# Patient Record
Sex: Female | Born: 1975
Health system: Southern US, Community
[De-identification: ages and names within clinical notes are randomized; demographics above are authoritative.]

## PROBLEM LIST (undated history)

## (undated) DIAGNOSIS — E119 Type 2 diabetes mellitus without complications: Secondary | ICD-10-CM

## (undated) DIAGNOSIS — F419 Anxiety disorder, unspecified: Secondary | ICD-10-CM

## (undated) DIAGNOSIS — D689 Coagulation defect, unspecified: Secondary | ICD-10-CM

## (undated) DIAGNOSIS — R7303 Prediabetes: Secondary | ICD-10-CM

## (undated) DIAGNOSIS — G43909 Migraine, unspecified, not intractable, without status migrainosus: Secondary | ICD-10-CM

## (undated) DIAGNOSIS — D649 Anemia, unspecified: Secondary | ICD-10-CM

## (undated) DIAGNOSIS — Z5189 Encounter for other specified aftercare: Secondary | ICD-10-CM

## (undated) DIAGNOSIS — R011 Cardiac murmur, unspecified: Secondary | ICD-10-CM

## (undated) DIAGNOSIS — I219 Acute myocardial infarction, unspecified: Secondary | ICD-10-CM

## (undated) HISTORY — DX: Coagulation defect, unspecified: D68.9

## (undated) HISTORY — DX: Migraine, unspecified, not intractable, without status migrainosus: G43.909

## (undated) HISTORY — PX: ABDOMINAL HYSTERECTOMY: SHX81

## (undated) HISTORY — DX: Encounter for other specified aftercare: Z51.89

## (undated) HISTORY — DX: Anxiety disorder, unspecified: F41.9

## (undated) HISTORY — PX: FOOT SURGERY: SHX648

## (undated) HISTORY — DX: Prediabetes: R73.03

## (undated) HISTORY — DX: Cardiac murmur, unspecified: R01.1

## (undated) HISTORY — PX: BREAST REDUCTION SURGERY: SHX8

## (undated) HISTORY — DX: Acute myocardial infarction, unspecified: I21.9

## (undated) HISTORY — PX: BREAST SURGERY: SHX581

## (undated) HISTORY — DX: Anemia, unspecified: D64.9

---

## 2008-11-07 DIAGNOSIS — N643 Galactorrhea not associated with childbirth: Secondary | ICD-10-CM | POA: Insufficient documentation

## 2008-11-07 DIAGNOSIS — K029 Dental caries, unspecified: Secondary | ICD-10-CM | POA: Insufficient documentation

## 2008-11-07 DIAGNOSIS — D649 Anemia, unspecified: Secondary | ICD-10-CM | POA: Insufficient documentation

## 2009-12-02 DIAGNOSIS — I2699 Other pulmonary embolism without acute cor pulmonale: Secondary | ICD-10-CM | POA: Insufficient documentation

## 2009-12-10 DIAGNOSIS — G43009 Migraine without aura, not intractable, without status migrainosus: Secondary | ICD-10-CM | POA: Insufficient documentation

## 2010-02-13 DIAGNOSIS — Z7901 Long term (current) use of anticoagulants: Secondary | ICD-10-CM | POA: Insufficient documentation

## 2010-05-20 DIAGNOSIS — F419 Anxiety disorder, unspecified: Secondary | ICD-10-CM | POA: Insufficient documentation

## 2010-05-20 DIAGNOSIS — L659 Nonscarring hair loss, unspecified: Secondary | ICD-10-CM | POA: Insufficient documentation

## 2010-05-20 DIAGNOSIS — N62 Hypertrophy of breast: Secondary | ICD-10-CM | POA: Insufficient documentation

## 2010-12-31 DIAGNOSIS — N62 Hypertrophy of breast: Secondary | ICD-10-CM | POA: Insufficient documentation

## 2011-04-08 DIAGNOSIS — R269 Unspecified abnormalities of gait and mobility: Secondary | ICD-10-CM | POA: Insufficient documentation

## 2011-10-23 DIAGNOSIS — M216X9 Other acquired deformities of unspecified foot: Secondary | ICD-10-CM | POA: Insufficient documentation

## 2011-10-23 DIAGNOSIS — M205X9 Other deformities of toe(s) (acquired), unspecified foot: Secondary | ICD-10-CM | POA: Insufficient documentation

## 2013-07-27 DIAGNOSIS — Z9889 Other specified postprocedural states: Secondary | ICD-10-CM | POA: Insufficient documentation

## 2014-01-28 DIAGNOSIS — N920 Excessive and frequent menstruation with regular cycle: Secondary | ICD-10-CM | POA: Insufficient documentation

## 2015-08-28 DIAGNOSIS — Z9889 Other specified postprocedural states: Secondary | ICD-10-CM | POA: Insufficient documentation

## 2017-12-28 DIAGNOSIS — R0602 Shortness of breath: Secondary | ICD-10-CM | POA: Insufficient documentation

## 2019-09-21 DIAGNOSIS — D500 Iron deficiency anemia secondary to blood loss (chronic): Secondary | ICD-10-CM | POA: Insufficient documentation

## 2019-10-17 DIAGNOSIS — D219 Benign neoplasm of connective and other soft tissue, unspecified: Secondary | ICD-10-CM | POA: Insufficient documentation

## 2020-02-22 ENCOUNTER — Other Ambulatory Visit (HOSPITAL_COMMUNITY): Payer: Self-pay

## 2020-02-22 MED FILL — ALBUTEROL SULFATE HFA 108 (: 108 (90 BAS | 17 days supply | Qty: 18 | Fill #0

## 2020-02-22 MED FILL — XARELTO 20 MG TABLET: 20 | 30 days supply | Qty: 30 | Fill #0

## 2020-04-25 ENCOUNTER — Other Ambulatory Visit (HOSPITAL_COMMUNITY): Payer: Self-pay

## 2020-04-25 MED FILL — OZEMPIC (1 MG/DOSE) 4 MG/3M: 4 | 84 days supply | Qty: 9 | Fill #0

## 2020-04-25 MED FILL — METFORMIN HCL ER 500 MG TB2: 500 | 90 days supply | Qty: 360 | Fill #0

## 2020-07-29 ENCOUNTER — Other Ambulatory Visit (HOSPITAL_COMMUNITY): Payer: Self-pay

## 2020-07-30 MED FILL — OZEMPIC (1 MG/DOSE) 4 MG/3M: 4 | 84 days supply | Qty: 9 | Fill #0

## 2020-09-11 ENCOUNTER — Encounter: Payer: Self-pay | Admitting: Nurse Practitioner

## 2020-09-11 ENCOUNTER — Ambulatory Visit (INDEPENDENT_AMBULATORY_CARE_PROVIDER_SITE_OTHER): Payer: No Typology Code available for payment source | Admitting: Nurse Practitioner

## 2020-09-11 ENCOUNTER — Other Ambulatory Visit: Payer: Self-pay

## 2020-09-11 ENCOUNTER — Other Ambulatory Visit (HOSPITAL_COMMUNITY): Payer: Self-pay

## 2020-09-11 VITALS — BP 122/84 | HR 86 | Temp 98.1°F | Ht 65.4 in | Wt 220.6 lb

## 2020-09-11 DIAGNOSIS — M546 Pain in thoracic spine: Secondary | ICD-10-CM | POA: Diagnosis not present

## 2020-09-11 DIAGNOSIS — E559 Vitamin D deficiency, unspecified: Secondary | ICD-10-CM

## 2020-09-11 DIAGNOSIS — Z1159 Encounter for screening for other viral diseases: Secondary | ICD-10-CM

## 2020-09-11 DIAGNOSIS — Z7689 Persons encountering health services in other specified circumstances: Secondary | ICD-10-CM

## 2020-09-11 DIAGNOSIS — Z114 Encounter for screening for human immunodeficiency virus [HIV]: Secondary | ICD-10-CM

## 2020-09-11 DIAGNOSIS — Z86718 Personal history of other venous thrombosis and embolism: Secondary | ICD-10-CM

## 2020-09-11 DIAGNOSIS — R7303 Prediabetes: Secondary | ICD-10-CM | POA: Diagnosis not present

## 2020-09-11 DIAGNOSIS — G8929 Other chronic pain: Secondary | ICD-10-CM

## 2020-09-11 DIAGNOSIS — Z6836 Body mass index (BMI) 36.0-36.9, adult: Secondary | ICD-10-CM

## 2020-09-11 DIAGNOSIS — Z86711 Personal history of pulmonary embolism: Secondary | ICD-10-CM

## 2020-09-11 MED ORDER — PREDNISONE 5 MG PO TABS
ORAL_TABLET | ORAL | 0 refills | Status: AC
  Filled 2020-09-11: qty 21, 6d supply, fill #0

## 2020-09-11 NOTE — Progress Notes (Signed)
I,Yamilka Roman [employer] as a Education administrator for [employer], FNP.,have documented all relevant documentation on the behalf of [name], FNP,as directed by  [name], FNP while in the presence of [name], [place]. This visit occurred during the SARS-CoV-2 public health emergency.  Safety protocols were in place, including screening questions prior to the visit, additional usage of staff PPE, and extensive cleaning of exam room while observing appropriate contact time as indicated for disinfecting solutions.  Subjective:     Patient ID: Kristi Frank , female    DOB: 1975/06/11 , 45 y.o.   MRN: [id number]   Chief Complaint  Patient presents with  . Establish Care  . Back Pain    Patient stated she has been having back pain since last year the pain comes and goes. She did have a scan sometime ago back in [place] and was told it may be her sciatic nerve.      HPI  Patient presents today to establish primary care. She was referred by her friend Sharman Crate who may come to the practice. She recently moved here in October from [place].  She works at [employer] in [employer].  Single. 3 children - 2 daughters live locally and son lives in [place].   She would like to be seen for back pain. Patient stated she has been having back pain since last year the pain comes and goes. She did have a scan sometime ago back in [place] and was told it may be her sciatic nerve.    PMH - Pulmonary Embolism and several other blood clots (lower left back, she had 2 in 2011 possible left side). She takes Xarelto (will sometimes take and sometimes does not). She has been seen by a Hematologist, she did take lovenox for 1-2 years. She was able to get her weight down and that is when she began taking the Xarelto. She has not taken in several months. Anxiety - used to take a medication after having her PE in 2011. In the last few months has not been doing as well with her anxiety. Migraine -   Prediabetes - has been on Ozempic consistently since last January and metformin. Her highest weight was 315 lb in 2020.   She is approximately 100 lbs lighter now.   Wt Readings from Last 3 Encounters: 09/11/20 : 220 lb 9.6 oz (100.1 kg)  She had a medication for what she thought was for nerve pain to her back, had relief.  Pain will improve up to 60%, denies numbness or tingling. Denies having shingles. She does report sometimes she has irritation to her back where she has pain.   Back Pain This is a chronic problem. The current episode started more than 1 month ago (August 2021). The problem has been gradually worsening since onset. Pertinent negatives include no abdominal pain, chest pain, headaches, numbness or tingling. (She will also feel pain down her left hip)     Past Medical History:  Diagnosis Date  . Anxiety   . Blood clotting disorder ([place])   . Migraine   . Prediabetes      Family History  Problem Relation Age of Onset  . Epilepsy Mother   . Hypertension Mother   . Healthy Father   . Diabetes Maternal Aunt   . Cancer Paternal Uncle   . Healthy Maternal Grandmother   . Dementia Paternal Grandmother   . Heart disease Paternal Grandfather   . Clotting disorder Other  Current Outpatient Medications:  .  albuterol (VENTOLIN HFA) 108 (90 Base) MCG/ACT inhaler, INHALE 2 PUFFS EVERY 4 HOURS AS NEEDED FOR SHORTNESS OF BREATH., Disp: 18 g, Rfl: 0 .  metFORMIN (GLUCOPHAGE-XR) 500 MG 24 hr tablet, TAKE 2 TABLETS BY MOUTH 2 TIMES A DAY WITH MEALS, Disp: 360 tablet, Rfl: 0 .  predniSONE (DELTASONE) 5 MG tablet, Take 6 tablets by mouth on day 1,  5 tablets on day 2,  4 tablets on day 3, 3 tablets on day 4, 2 tablets on day 5, and 1 tablet on day 6 and stop, Disp: 21 tablet, Rfl: 0 .  rivaroxaban (XARELTO) 20 MG TABS tablet, TAKE 1 TABLET BY MOUTH DAILY WITH DINNER., Disp: 90 tablet, Rfl: 0 .  Semaglutide, 1 MG/DOSE, 4 MG/3ML SOPN, INJECT 1 MG UNDER THE SKIN ONCE A WEEK.,  Disp: 9 mL, Rfl: 0   No Known Allergies   Review of Systems  Constitutional: Negative.  Negative for fatigue.  Respiratory: Negative.  Negative for cough and shortness of breath.   Cardiovascular: Negative.  Negative for chest pain.  Gastrointestinal: Negative.  Negative for abdominal pain.  Endocrine: Negative.  Negative for polydipsia, polyphagia and polyuria.  Musculoskeletal: Positive for back pain.  Skin: Negative.  Negative for rash and wound.       She will have discomfort or irritation to the left upper back Sharp burning pain  Neurological: Negative.  Negative for dizziness, tingling, numbness and headaches.  Psychiatric/Behavioral: Negative for confusion. The patient is not nervous/anxious.      Today's Vitals   09/11/20 1557  BP: 122/84  Pulse: 86  Temp: 98.1 F (36.7 C)  TempSrc: Oral  SpO2: 99%  Weight: 220 lb 9.6 oz (100.1 kg)  Height: 5' 5.4" (1.661 m)  PainSc: 2   PainLoc: Back   Body mass index is 36.26 kg/m.   Objective:  Physical Exam Vitals reviewed.  Cardiovascular:     Rate and Rhythm: Normal rate and regular rhythm.     Pulses: Normal pulses.     Heart sounds: Normal heart sounds. No murmur heard.   Pulmonary:     Effort: Pulmonary effort is normal. No respiratory distress.     Breath sounds: Normal breath sounds. No wheezing.  Skin:    General: Skin is warm and dry.     Capillary Refill: Capillary refill takes less than 2 seconds.     Coloration: Skin is not jaundiced.     Findings: No bruising.  Neurological:     General: No focal deficit present.     Mental Status: She is oriented to person, place, and time.     Cranial Nerves: No cranial nerve deficit.  Psychiatric:        Mood and Affect: Mood normal.        Behavior: Behavior normal.        Thought Content: Thought content normal.        Judgment: Judgment normal.         Assessment And Plan:     1. Chronic left-sided thoracic back pain  Sciatic back pain vs muscle  strain  Will treat with steroid dose pack  Encouraged to stretch regularly - predniSONE (DELTASONE) 5 MG tablet; Take 6 tablets by mouth on day 1,  5 tablets on day 2,  4 tablets on day 3, 3 tablets on day 4, 2 tablets on day 5, and 1 tablet on day 6 and stop  Dispense: 21 tablet; Refill: 0  2.  Vitamin D deficiency  Will check vitamin D level and supplement as needed.     Also encouraged to spend 15 minutes in the sun daily.  - VITAMIN D 25 Hydroxy (Vit-D Deficiency, Fractures)  3. Prediabetes  Chronic, controlled  Continue with current medications  Encouraged to limit intake of sugary foods and drinks  Encouraged to increase physical activity to 150 minutes per week  4. BMI 36.0-36.9,adult  Chronic  Discussed healthy diet and regular exercise options   Encouraged to exercise at least 150 minutes per week with 2 days of strength training  5. History of pulmonary embolism Taking Xarelto  6. History of blood clots  7. Encounter for hepatitis C screening test for low risk patient  Will check Hepatitis C screening due to recent recommendations to screen all adults 18 years and older - Hepatitis C antibody  8. Encounter for HIV (human immunodeficiency virus) test - HIV Antibody (routine testing w rflx)  9. Establishing care with new doctor, encounter for     Patient was given opportunity to ask questions. Patient verbalized understanding of the plan and was able to repeat key elements of the plan. All questions were answered to their satisfaction.  [name], FNP   I, [name], FNP, have reviewed all documentation for this visit. The documentation on 09/11/20 for the exam, diagnosis, procedures, and orders are all accurate and complete.   IF YOU HAVE BEEN REFERRED TO A SPECIALIST, IT MAY TAKE 1-2 WEEKS TO SCHEDULE/PROCESS THE REFERRAL. IF YOU HAVE NOT HEARD FROM US/SPECIALIST IN TWO WEEKS, PLEASE GIVE [place] A CALL AT [phone] X [phone].   THE PATIENT IS  ENCOURAGED TO PRACTICE SOCIAL DISTANCING DUE TO THE COVID-19 PANDEMIC.

## 2020-09-11 NOTE — Patient Instructions (Signed)
Sciatica Rehab Ask your health care provider which exercises are safe for you. Do exercises exactly as told by your health care provider and adjust them as directed. It is normal to feel mild stretching, pulling, tightness, or discomfort as you do these exercises. Stop right away if you feel sudden pain or your pain gets worse. Do not begin these exercises until told by your health care provider. Stretching and range-of-motion exercises These exercises warm up your muscles and joints and improve the movement and flexibility of your hips and back. These exercises also help to relieve pain, numbness, and tingling. Sciatic nerve glide 1. Sit in a chair with your head facing down toward your chest. Place your hands behind your back. Let your shoulders slump forward. 2. Slowly straighten one of your legs while you tilt your head back as if you are looking toward the ceiling. Only straighten your leg as far as you can without making your symptoms worse. 3. Hold this position for __________ seconds. 4. Slowly return to the starting position. 5. Repeat with your other leg. Repeat __________ times. Complete this exercise __________ times a day. Knee to chest with hip adduction and internal rotation 1. Lie on your back on a firm surface with both legs straight. 2. Bend one of your knees and move it up toward your chest until you feel a gentle stretch in your lower back and buttock. Then, move your knee toward the shoulder that is on the opposite side from your leg. This is hip adduction and internal rotation. ? Hold your leg in this position by holding on to the front of your knee. 3. Hold this position for __________ seconds. 4. Slowly return to the starting position. 5. Repeat with your other leg. Repeat __________ times. Complete this exercise __________ times a day.   Prone extension on elbows 1. Lie on your abdomen on a firm surface. A bed may be too soft for this exercise. 2. Prop yourself up on  your elbows. 3. Use your arms to help lift your chest up until you feel a gentle stretch in your abdomen and your lower back. ? This will place some of your body weight on your elbows. If this is uncomfortable, try stacking pillows under your chest. ? Your hips should stay down, against the surface that you are lying on. Keep your hip and back muscles relaxed. 4. Hold this position for __________ seconds. 5. Slowly relax your upper body and return to the starting position. Repeat __________ times. Complete this exercise __________ times a day.   Strengthening exercises These exercises build strength and endurance in your back. Endurance is the ability to use your muscles for a long time, even after they get tired. Pelvic tilt This exercise strengthens the muscles that lie deep in the abdomen. 1. Lie on your back on a firm surface. Bend your knees and keep your feet flat on the floor. 2. Tense your abdominal muscles. Tip your pelvis up toward the ceiling and flatten your lower back into the floor. ? To help with this exercise, you may place a small towel under your lower back and try to push your back into the towel. 3. Hold this position for __________ seconds. 4. Let your muscles relax completely before you repeat this exercise. Repeat __________ times. Complete this exercise __________ times a day. Alternating arm and leg raises 1. Get on your hands and knees on a firm surface. If you are on a hard floor, you may want to  use padding, such as an exercise mat, to cushion your knees. 2. Line up your arms and legs. Your hands should be directly below your shoulders, and your knees should be directly below your hips. 3. Lift your left leg behind you. At the same time, raise your right arm and straighten it in front of you. ? Do not lift your leg higher than your hip. ? Do not lift your arm higher than your shoulder. ? Keep your abdominal and back muscles tight. ? Keep your hips facing the  ground. ? Do not arch your back. ? Keep your balance carefully, and do not hold your breath. 4. Hold this position for __________ seconds. 5. Slowly return to the starting position. 6. Repeat with your right leg and your left arm. Repeat __________ times. Complete this exercise __________ times a day.   Posture and body mechanics Good posture and healthy body mechanics can help to relieve stress in your body's tissues and joints. Body mechanics refers to the movements and positions of your body while you do your daily activities. Posture is part of body mechanics. Good posture means:  Your spine is in its natural S-curve position (neutral).  Your shoulders are pulled back slightly.  Your head is not tipped forward. Follow these guidelines to improve your posture and body mechanics in your everyday activities. Standing  When standing, keep your spine neutral and your feet about hip width apart. Keep a slight bend in your knees. Your ears, shoulders, and hips should line up.  When you do a task in which you stand in one place for a long time, place one foot up on a stable object that is 2-4 inches (5-10 cm) high, such as a footstool. This helps keep your spine neutral.   Sitting  When sitting, keep your spine neutral and keep your feet flat on the floor. Use a footrest, if necessary, and keep your thighs parallel to the floor. Avoid rounding your shoulders, and avoid tilting your head forward.  When working at a desk or a computer, keep your desk at a height where your hands are slightly lower than your elbows. Slide your chair under your desk so you are close enough to maintain good posture.  When working at a computer, place your monitor at a height where you are looking straight ahead and you do not have to tilt your head forward or downward to look at the screen.   Resting  When lying down and resting, avoid positions that are most painful for you.  If you have pain with activities  such as sitting, bending, stooping, or squatting, lie in a position in which your body does not bend very much. For example, avoid curling up on your side with your arms and knees near your chest (fetal position).  If you have pain with activities such as standing for a long time or reaching with your arms, lie with your spine in a neutral position and bend your knees slightly. Try the following positions: ? Lying on your side with a pillow between your knees. ? Lying on your back with a pillow under your knees. Lifting  When lifting objects, keep your feet at least shoulder width apart and tighten your abdominal muscles.  Bend your knees and hips and keep your spine neutral. It is important to lift using the strength of your legs, not your back. Do not lock your knees straight out.  Always ask for help to lift heavy or awkward objects.  This information is not intended to replace advice given to you by your health care provider. Make sure you discuss any questions you have with your health care provider. Document Revised: 08/12/2018 Document Reviewed: 05/12/2018 Elsevier Patient Education  2021 [employer] Inc.  

## 2020-09-12 ENCOUNTER — Other Ambulatory Visit (HOSPITAL_COMMUNITY): Payer: Self-pay

## 2020-09-12 ENCOUNTER — Other Ambulatory Visit: Payer: Self-pay | Admitting: Nurse Practitioner

## 2020-09-12 DIAGNOSIS — E559 Vitamin D deficiency, unspecified: Secondary | ICD-10-CM

## 2020-09-12 LAB — HIV ANTIBODY (ROUTINE TESTING W REFLEX): HIV Screen 4th Generation wRfx: NONREACTIVE

## 2020-09-12 LAB — HEPATITIS C ANTIBODY: Hep C Virus Ab: <0.1 s/co ratio (ref 0.0–0.9)

## 2020-09-12 LAB — VITAMIN D 25 HYDROXY (VIT D DEFICIENCY, FRACTURES): Vit D, 25-Hydroxy: 14.3 ng/mL — ABNORMAL LOW (ref 30.0–100.0)

## 2020-09-12 MED ORDER — VITAMIN D (ERGOCALCIFEROL) 1.25 MG (50000 UNIT) PO CAPS
50000.0000 [IU] | ORAL_CAPSULE | ORAL | 1 refills | Status: DC
  Filled 2020-09-12 – 2020-12-05 (×2): qty 24, 84d supply, fill #0

## 2020-09-20 ENCOUNTER — Other Ambulatory Visit (HOSPITAL_COMMUNITY): Payer: Self-pay

## 2020-10-24 ENCOUNTER — Ambulatory Visit: Payer: No Typology Code available for payment source | Admitting: Nurse Practitioner

## 2020-11-11 ENCOUNTER — Other Ambulatory Visit (HOSPITAL_COMMUNITY): Payer: Self-pay

## 2020-11-11 ENCOUNTER — Ambulatory Visit (INDEPENDENT_AMBULATORY_CARE_PROVIDER_SITE_OTHER): Payer: No Typology Code available for payment source | Admitting: Nurse Practitioner

## 2020-11-11 ENCOUNTER — Other Ambulatory Visit: Payer: Self-pay

## 2020-11-11 ENCOUNTER — Encounter: Payer: Self-pay | Admitting: Nurse Practitioner

## 2020-11-11 VITALS — BP 126/80 | HR 85 | Temp 97.9°F | Ht 65.4 in | Wt 223.4 lb

## 2020-11-11 DIAGNOSIS — Z9109 Other allergy status, other than to drugs and biological substances: Secondary | ICD-10-CM | POA: Diagnosis not present

## 2020-11-11 DIAGNOSIS — R21 Rash and other nonspecific skin eruption: Secondary | ICD-10-CM

## 2020-11-11 MED ORDER — CLOBETASOL PROPIONATE 0.05 % EX OINT
1.0000 "application " | TOPICAL_OINTMENT | Freq: Two times a day (BID) | CUTANEOUS | 2 refills | Status: DC
  Filled 2020-11-11 – 2020-12-03 (×2): qty 30, 15d supply, fill #0

## 2020-11-11 NOTE — Progress Notes (Signed)
I,Yamilka [employer] as a Education administrator for [employer], FNP.,have documented all relevant documentation on the behalf of [name], FNP,as directed by  [name], FNP while in the presence of [name], [place].  This visit occurred during the SARS-CoV-2 public health emergency.  Safety protocols were in place, including screening questions prior to the visit, additional usage of staff PPE, and extensive cleaning of exam room while observing appropriate contact time as indicated for disinfecting solutions.  Subjective:     Patient ID: Kristi Frank , female    DOB: May 17, 1975 , 45 y.o.   MRN: [id number]   Chief Complaint  Patient presents with   discoloration of hands and arms    HPI  Patient presents today for some discoloration on her hands and arms. Reports in 2009 sh ehad a rash to her chest area. She began getting spots on her hands. Worse from April to September. She has sleeves for when she is driving. On a bad day she red, raised area to chest and hands. She will also have itching. She is not taking any antihistamines.      Past Medical History:  Diagnosis Date   Anxiety    Blood clotting disorder ([place])    Migraine    Prediabetes      Family History  Problem Relation Age of Onset   Epilepsy Mother    Hypertension Mother    Healthy Father    Diabetes Maternal Aunt    Cancer Paternal Uncle    Healthy Maternal Grandmother    Dementia Paternal Grandmother    Heart disease Paternal Grandfather    Clotting disorder Other      Current Outpatient Medications:    albuterol (VENTOLIN HFA) 108 (90 Base) MCG/ACT inhaler, INHALE 2 PUFFS EVERY 4 HOURS AS NEEDED FOR SHORTNESS OF BREATH., Disp: 18 g, Rfl: 0   clobetasol ointment (TEMOVATE) 2.95 %, Apply 1 application topically 2 (two) times daily., Disp: 30 g, Rfl: 2   metFORMIN (GLUCOPHAGE-XR) 500 MG 24 hr tablet, TAKE 2 TABLETS BY MOUTH 2 TIMES A DAY WITH MEALS, Disp: 360 tablet, Rfl: 0   rivaroxaban (XARELTO) 20 MG  TABS tablet, TAKE 1 TABLET BY MOUTH DAILY WITH DINNER., Disp: 90 tablet, Rfl: 0   Semaglutide, 1 MG/DOSE, 4 MG/3ML SOPN, INJECT 1 MG UNDER THE SKIN ONCE A WEEK., Disp: 9 mL, Rfl: 0   Vitamin D, Ergocalciferol, (DRISDOL) 1.25 MG (50000 UNIT) CAPS capsule, Take 1 capsule (50,000 Units total) by mouth 2 (two) times a week., Disp: 24 capsule, Rfl: 1   No Known Allergies   Review of Systems  Constitutional: Negative.   Respiratory: Negative.    Cardiovascular: Negative.  Negative for chest pain, palpitations and leg swelling.  Neurological:  Negative for dizziness and facial asymmetry.  Psychiatric/Behavioral: Negative.      Today's Vitals   11/11/20 1654  BP: 126/80  Pulse: 85  Temp: 97.9 F (36.6 C)  Weight: 223 lb 6.4 oz (101.3 kg)  Height: 5' 5.4" (1.661 m)  PainSc: 0-No pain   Body mass index is 36.72 kg/m.   Objective:  Physical Exam Vitals reviewed.  Constitutional:      General: She is not in acute distress.    Appearance: Normal appearance.  Cardiovascular:     Pulses: Normal pulses.     Heart sounds: Normal heart sounds. No murmur heard. Pulmonary:     Effort: Pulmonary effort is normal. No respiratory distress.     Breath sounds: Normal breath sounds.  No wheezing.  Skin:    Findings: Rash (hypopigmented areas to her hands and raised vesicles) present.  Neurological:     General: No focal deficit present.     Mental Status: She is alert and oriented to person, place, and time.  Psychiatric:        Mood and Affect: Mood normal.        Behavior: Behavior normal.        Thought Content: Thought content normal.        Judgment: Judgment normal.        Assessment And Plan:     1. Sun allergy Has hypopigmented skin with raised vesicles, will treat with clobetasol, if not effective will refer to Dermatology - clobetasol ointment (TEMOVATE) 0.05 %; Apply 1 application topically 2 (two) times daily.  Dispense: 30 g; Refill: 2    Patient was given opportunity to  ask questions. Patient verbalized understanding of the plan and was able to repeat key elements of the plan. All questions were answered to their satisfaction.  [name], FNP   I, [name], FNP, have reviewed all documentation for this visit. The documentation on 11/11/20 for the exam, diagnosis, procedures, and orders are all accurate and complete.   IF YOU HAVE BEEN REFERRED TO A SPECIALIST, IT MAY TAKE 1-2 WEEKS TO SCHEDULE/PROCESS THE REFERRAL. IF YOU HAVE NOT HEARD FROM US/SPECIALIST IN TWO WEEKS, PLEASE GIVE [place] A CALL AT [phone].   THE PATIENT IS ENCOURAGED TO PRACTICE SOCIAL DISTANCING DUE TO THE COVID-19 PANDEMIC.

## 2020-11-17 ENCOUNTER — Encounter: Payer: Self-pay | Admitting: Nurse Practitioner

## 2020-11-19 ENCOUNTER — Other Ambulatory Visit (HOSPITAL_COMMUNITY): Payer: Self-pay

## 2020-11-21 ENCOUNTER — Other Ambulatory Visit (HOSPITAL_COMMUNITY): Payer: Self-pay

## 2020-11-21 MED ORDER — METFORMIN HCL ER 500 MG PO TB24
1000.0000 mg | ORAL_TABLET | Freq: Two times a day (BID) | ORAL | 1 refills | Status: DC
  Filled 2020-11-21 – 2020-12-05 (×2): qty 120, 30d supply, fill #0
  Filled 2021-01-13: qty 120, 30d supply, fill #1

## 2020-11-21 MED ORDER — OZEMPIC (1 MG/DOSE) 4 MG/3ML ~~LOC~~ SOPN
1.0000 mg | PEN_INJECTOR | SUBCUTANEOUS | 1 refills | Status: DC
  Filled 2020-11-21 – 2020-12-03 (×2): qty 3, 28d supply, fill #0
  Filled 2021-01-13: qty 3, 28d supply, fill #1

## 2020-11-29 ENCOUNTER — Other Ambulatory Visit (HOSPITAL_COMMUNITY): Payer: Self-pay

## 2020-12-03 ENCOUNTER — Other Ambulatory Visit (HOSPITAL_COMMUNITY): Payer: Self-pay

## 2020-12-05 ENCOUNTER — Other Ambulatory Visit (HOSPITAL_COMMUNITY): Payer: Self-pay

## 2021-01-13 ENCOUNTER — Other Ambulatory Visit (HOSPITAL_COMMUNITY): Payer: Self-pay

## 2021-02-06 ENCOUNTER — Other Ambulatory Visit (HOSPITAL_COMMUNITY): Payer: Self-pay

## 2021-02-06 ENCOUNTER — Encounter: Payer: Self-pay | Admitting: Nurse Practitioner

## 2021-02-06 MED FILL — Rivaroxaban Tab 20 MG: ORAL | 30 days supply | Qty: 30 | Fill #0 | Status: AC

## 2021-02-07 ENCOUNTER — Telehealth: Payer: Self-pay

## 2021-02-07 NOTE — Telephone Encounter (Signed)
The pt was contacted for an appointment, appt scheduled.

## 2021-02-10 ENCOUNTER — Other Ambulatory Visit (HOSPITAL_COMMUNITY): Payer: Self-pay

## 2021-02-10 ENCOUNTER — Encounter: Payer: Self-pay | Admitting: Nurse Practitioner

## 2021-02-10 ENCOUNTER — Other Ambulatory Visit: Payer: Self-pay

## 2021-02-10 ENCOUNTER — Ambulatory Visit (INDEPENDENT_AMBULATORY_CARE_PROVIDER_SITE_OTHER): Payer: No Typology Code available for payment source | Admitting: Nurse Practitioner

## 2021-02-10 VITALS — BP 134/80 | HR 84 | Temp 97.9°F | Ht 65.8 in | Wt 231.6 lb

## 2021-02-10 DIAGNOSIS — R29898 Other symptoms and signs involving the musculoskeletal system: Secondary | ICD-10-CM | POA: Diagnosis not present

## 2021-02-10 DIAGNOSIS — Z23 Encounter for immunization: Secondary | ICD-10-CM | POA: Diagnosis not present

## 2021-02-10 DIAGNOSIS — R7303 Prediabetes: Secondary | ICD-10-CM

## 2021-02-10 DIAGNOSIS — Z6837 Body mass index (BMI) 37.0-37.9, adult: Secondary | ICD-10-CM

## 2021-02-10 DIAGNOSIS — Z1231 Encounter for screening mammogram for malignant neoplasm of breast: Secondary | ICD-10-CM

## 2021-02-10 DIAGNOSIS — Z86711 Personal history of pulmonary embolism: Secondary | ICD-10-CM

## 2021-02-10 DIAGNOSIS — E6609 Other obesity due to excess calories: Secondary | ICD-10-CM | POA: Diagnosis not present

## 2021-02-10 MED ORDER — OZEMPIC (2 MG/DOSE) 8 MG/3ML ~~LOC~~ SOPN
2.0000 mg | PEN_INJECTOR | SUBCUTANEOUS | 1 refills | Status: DC
  Filled 2021-02-10 – 2021-03-10 (×2): qty 3, 28d supply, fill #0
  Filled 2021-04-08: qty 3, 28d supply, fill #1
  Filled 2021-05-21: qty 3, 28d supply, fill #2

## 2021-02-10 NOTE — Progress Notes (Signed)
I,Yamilka J Llittleton,acting as a Education administrator for [employer], FNP.,have documented all relevant documentation on the behalf of [name], FNP,as directed by  [name], FNP while in the presence of [name], [place].   This visit occurred during the SARS-CoV-2 public health emergency.  Safety protocols were in place, including screening questions prior to the visit, additional usage of staff PPE, and extensive cleaning of exam room while observing appropriate contact time as indicated for disinfecting solutions.  Subjective:     Patient ID: Kristi Frank , female    DOB: 1975/05/07 , 45 y.o.   MRN: [id number]   Chief Complaint  Patient presents with   Medication Refill    HPI  Patient stated she was still getting refills from her previous provider. The medications are metformin and ozempic. The previous provider wanted to start her on wegovy but is unable to continue refilling her medications.  She has not missed a dose of her Ozempic. She has taken Orlistat in the past.  Her heaviest weight was 313 lbs at the beginning of last year.   She also complains of having some pain in her right shoulder and she also feels like her her left leg is really heavy. She is concerned about the circulation in her leg. Reports feeling like her left leg is heavy.   She reports having a PE in 2011 and had another blood clot in 2019 where she was hospitalized that affected her lungs. She did go to a hematologist in [place]. She also went to the Coumadin clinic in the past when taking coumadin. She also took Lovenox. When she relocated here she was started on Xarelto. She has not been established with a Hematologist here.   Wt Readings from Last 3 Encounters: 02/10/21 : 231 lb 9.6 oz (105.1 kg) 11/11/20 : 223 lb 6.4 oz (101.3 kg) 09/11/20 : 220 lb 9.6 oz (100.1 kg)    Medication Refill This is a chronic problem. The current episode started more than 1 year ago. Pertinent negatives include no  chest pain, fatigue or headaches.    Past Medical History:  Diagnosis Date   Anxiety    Blood clotting disorder ([place])    Migraine    Prediabetes      Family History  Problem Relation Age of Onset   Epilepsy Mother    Hypertension Mother    Healthy Father    Diabetes Maternal Aunt    Cancer Paternal Uncle    Healthy Maternal Grandmother    Dementia Paternal Grandmother    Heart disease Paternal Grandfather    Clotting disorder Other      Current Outpatient Medications:    albuterol (VENTOLIN HFA) 108 (90 Base) MCG/ACT inhaler, INHALE 2 PUFFS EVERY 4 HOURS AS NEEDED FOR SHORTNESS OF BREATH., Disp: 18 g, Rfl: 0   clobetasol ointment (TEMOVATE) 7.65 %, Apply 1 application topically 2 (two) times daily., Disp: 30 g, Rfl: 2   metFORMIN (GLUCOPHAGE-XR) 500 MG 24 hr tablet, TAKE 2 TABLETS BY MOUTH 2 TIMES A DAY WITH MEALS, Disp: 360 tablet, Rfl: 0   rivaroxaban (XARELTO) 20 MG TABS tablet, Take 1 tablet (20 mg total) by mouth daily with dinner, Disp: 90 tablet, Rfl: 0   Semaglutide, 2 MG/DOSE, (OZEMPIC, 2 MG/DOSE,) 8 MG/3ML SOPN, Inject 2 mg into the skin once a week., Disp: 9 mL, Rfl: 1   Vitamin D, Ergocalciferol, (DRISDOL) 1.25 MG (50000 UNIT) CAPS capsule, Take 1 capsule (50,000 Units total) by mouth 2 (two) times  a week., Disp: 24 capsule, Rfl: 1   No Known Allergies   Review of Systems  Constitutional: Negative.  Negative for fatigue.  Respiratory: Negative.    Cardiovascular: Negative.  Negative for chest pain, palpitations and leg swelling.  Neurological:  Negative for dizziness and headaches.  Psychiatric/Behavioral: Negative.      Today's Vitals   02/10/21 1421  BP: 134/80  Pulse: 84  Temp: 97.9 F (36.6 C)  Weight: 231 lb 9.6 oz (105.1 kg)  Height: 5' 5.8" (1.671 m)  PainSc: 0-No pain   Body mass index is 37.61 kg/m.   Objective:  Physical Exam Vitals reviewed.  Constitutional:      General: She is not in acute distress.    Appearance: Normal appearance.  She is obese.  Cardiovascular:     Rate and Rhythm: Normal rate and regular rhythm.     Pulses: Normal pulses.     Heart sounds: Normal heart sounds. No murmur heard. Pulmonary:     Effort: Pulmonary effort is normal. No respiratory distress.     Breath sounds: Normal breath sounds. No wheezing.  Musculoskeletal:        General: No swelling or tenderness. Normal range of motion.     Right lower leg: No edema.     Left lower leg: No edema.  Skin:    Capillary Refill: Capillary refill takes less than 2 seconds.  Neurological:     General: No focal deficit present.     Mental Status: She is alert and oriented to person, place, and time.     Cranial Nerves: No cranial nerve deficit.     Motor: No weakness.  Psychiatric:        Mood and Affect: Mood normal.        Behavior: Behavior normal.        Thought Content: Thought content normal.        Judgment: Judgment normal.        Assessment And Plan:     1. Leg heaviness Comments: Strength is equal bilateral and is reporting heaviness to left lower ext, with history of PE's will check venous doppler.  - VAS [place] LOWER EXTREMITY VENOUS (DVT); Future - Vitamin B12 - TSH  2. Prediabetes Comments: She has been taking Ozempic 1 mg weekly and metformin, will increase dose to 2 mg weekly. Requested records from previous provider - Hemoglobin A1c - Insulin, random - BMP8+eGFR - Semaglutide, 2 MG/DOSE, (OZEMPIC, 2 MG/DOSE,) 8 MG/3ML SOPN; Inject 2 mg into the skin once a week.  Dispense: 9 mL; Refill: 1  3. Class 2 obesity due to excess calories with body mass index (BMI) of 37.0 to 37.9 in adult, unspecified whether serious comorbidity present Chronic Discussed healthy diet and regular exercise options  Weight has increased by approximately 10 lbs since last visit Encouraged to increase physical activity. - Semaglutide, 2 MG/DOSE, (OZEMPIC, 2 MG/DOSE,) 8 MG/3ML SOPN; Inject 2 mg into the skin once a week.  Dispense: 9 mL; Refill:  1  4. History of pulmonary embolism Comments: Continue Xarelto, and will refer to hematology to establish care, had been seeing a specialist in [place]. - Ambulatory referral to Hematology / Oncology  5. Breast cancer screening by mammogram Pt instructed on Self Breast Exam.According to ACOG guidelines Women aged 52 and older are recommended to get an annual mammogram. Form completed and given to patient contact the [facility] for appointment scheduing.  Pt encouraged to get annual mammogram - MM DIGITAL SCREENING  BILATERAL; Future  6. Immunization due Influenza vaccine administered Encouraged to take Tylenol as needed for fever or muscle aches. - Flu Vaccine QUAD 6+ mos PF IM (Fluarix Quad PF)    Patient was given opportunity to ask questions. Patient verbalized understanding of the plan and was able to repeat key elements of the plan. All questions were answered to their satisfaction.  [name], FNP   I, [name], FNP, have reviewed all documentation for this visit. The documentation on 02/10/21 for the exam, diagnosis, procedures, and orders are all accurate and complete.   IF YOU HAVE BEEN REFERRED TO A SPECIALIST, IT MAY TAKE 1-2 WEEKS TO SCHEDULE/PROCESS THE REFERRAL. IF YOU HAVE NOT HEARD FROM US/SPECIALIST IN TWO WEEKS, PLEASE GIVE [place] A CALL AT [phone].   THE PATIENT IS ENCOURAGED TO PRACTICE SOCIAL DISTANCING DUE TO THE COVID-19 PANDEMIC.

## 2021-02-10 NOTE — Patient Instructions (Signed)

## 2021-02-11 LAB — TSH: TSH: 2.21 u[IU]/mL (ref 0.450–4.500)

## 2021-02-11 LAB — BMP8+EGFR
BUN/Creatinine Ratio: 8 — ABNORMAL LOW (ref 9–23)
BUN: 6 mg/dL (ref 6–24)
CO2: 24 mmol/L (ref 20–29)
Calcium: 11.1 mg/dL — ABNORMAL HIGH (ref 8.7–10.2)
Chloride: 106 mmol/L (ref 96–106)
Creatinine, Ser: 0.71 mg/dL (ref 0.57–1.00)
Glucose: 86 mg/dL (ref 70–99)
Potassium: 4.1 mmol/L (ref 3.5–5.2)
Sodium: 143 mmol/L (ref 134–144)
eGFR: 108 mL/min/{1.73_m2} (ref >59)

## 2021-02-11 LAB — HEMOGLOBIN A1C
Est. average glucose Bld gHb Est-mCnc: 117 mg/dL
Hgb A1c MFr Bld: 5.7 % — ABNORMAL HIGH (ref 4.8–5.6)

## 2021-02-11 LAB — INSULIN, RANDOM: INSULIN: 22.3 u[IU]/mL (ref 2.6–24.9)

## 2021-02-11 LAB — VITAMIN B12: Vitamin B-12: 434 pg/mL (ref 232–1245)

## 2021-02-14 ENCOUNTER — Telehealth: Payer: Self-pay | Admitting: Oncology

## 2021-02-14 NOTE — Telephone Encounter (Signed)
Called patient to schedule new patient referral - unable to reach patient. Left message for patient to call back to confirm appointment.

## 2021-02-18 ENCOUNTER — Other Ambulatory Visit (HOSPITAL_COMMUNITY): Payer: Self-pay

## 2021-02-24 ENCOUNTER — Encounter (HOSPITAL_COMMUNITY): Payer: Self-pay

## 2021-02-24 ENCOUNTER — Inpatient Hospital Stay (HOSPITAL_COMMUNITY): Admission: RE | Admit: 2021-02-24 | Payer: Self-pay | Source: Ambulatory Visit

## 2021-02-26 ENCOUNTER — Encounter (HOSPITAL_COMMUNITY): Payer: Self-pay

## 2021-03-10 ENCOUNTER — Other Ambulatory Visit (HOSPITAL_COMMUNITY): Payer: Self-pay

## 2021-03-10 ENCOUNTER — Other Ambulatory Visit: Payer: Self-pay | Admitting: Nurse Practitioner

## 2021-03-11 ENCOUNTER — Other Ambulatory Visit (HOSPITAL_COMMUNITY): Payer: Self-pay

## 2021-03-11 MED ORDER — METFORMIN HCL ER 500 MG PO TB24
1000.0000 mg | ORAL_TABLET | Freq: Two times a day (BID) | ORAL | 0 refills | Status: DC
  Filled 2021-03-11: qty 360, 90d supply, fill #0

## 2021-03-14 ENCOUNTER — Telehealth: Payer: Self-pay | Admitting: *Deleted

## 2021-03-17 ENCOUNTER — Inpatient Hospital Stay: Payer: Self-pay

## 2021-03-17 ENCOUNTER — Inpatient Hospital Stay: Payer: Self-pay | Admitting: Oncology

## 2021-04-08 ENCOUNTER — Other Ambulatory Visit (HOSPITAL_COMMUNITY): Payer: Self-pay

## 2021-04-08 ENCOUNTER — Other Ambulatory Visit: Payer: Self-pay | Admitting: Nurse Practitioner

## 2021-05-21 ENCOUNTER — Other Ambulatory Visit (HOSPITAL_COMMUNITY): Payer: Self-pay

## 2021-06-04 ENCOUNTER — Other Ambulatory Visit (HOSPITAL_COMMUNITY): Payer: Self-pay

## 2021-06-04 ENCOUNTER — Other Ambulatory Visit: Payer: Self-pay

## 2021-06-04 ENCOUNTER — Encounter: Payer: Self-pay | Admitting: Nurse Practitioner

## 2021-06-04 ENCOUNTER — Ambulatory Visit (INDEPENDENT_AMBULATORY_CARE_PROVIDER_SITE_OTHER): Payer: No Typology Code available for payment source | Admitting: Nurse Practitioner

## 2021-06-04 VITALS — BP 130/70 | HR 86 | Temp 97.9°F | Ht 65.8 in | Wt 225.0 lb

## 2021-06-04 DIAGNOSIS — R112 Nausea with vomiting, unspecified: Secondary | ICD-10-CM | POA: Diagnosis not present

## 2021-06-04 DIAGNOSIS — E6609 Other obesity due to excess calories: Secondary | ICD-10-CM | POA: Insufficient documentation

## 2021-06-04 DIAGNOSIS — R109 Unspecified abdominal pain: Secondary | ICD-10-CM | POA: Diagnosis not present

## 2021-06-04 DIAGNOSIS — R7303 Prediabetes: Secondary | ICD-10-CM | POA: Diagnosis not present

## 2021-06-04 DIAGNOSIS — Z6837 Body mass index (BMI) 37.0-37.9, adult: Secondary | ICD-10-CM | POA: Insufficient documentation

## 2021-06-04 DIAGNOSIS — Z86718 Personal history of other venous thrombosis and embolism: Secondary | ICD-10-CM | POA: Insufficient documentation

## 2021-06-04 DIAGNOSIS — Z6836 Body mass index (BMI) 36.0-36.9, adult: Secondary | ICD-10-CM

## 2021-06-04 DIAGNOSIS — Z86711 Personal history of pulmonary embolism: Secondary | ICD-10-CM

## 2021-06-04 LAB — POCT URINALYSIS DIPSTICK
Bilirubin, UA: NEGATIVE
Blood, UA: NEGATIVE
Glucose, UA: NEGATIVE
Ketones, UA: NEGATIVE
Leukocytes, UA: NEGATIVE
Nitrite, UA: NEGATIVE
Protein, UA: NEGATIVE
Spec Grav, UA: >=1.03 — AB (ref 1.010–1.025)
Urobilinogen, UA: 0.2 E.U./dL
pH, UA: 5.5 (ref 5.0–8.0)

## 2021-06-04 MED ORDER — ONDANSETRON HCL 4 MG PO TABS
4.0000 mg | ORAL_TABLET | Freq: Every day | ORAL | 1 refills | Status: AC | PRN
  Filled 2021-06-04: qty 30, 30d supply, fill #0

## 2021-06-04 MED ORDER — PREDNISONE 20 MG PO TABS
40.0000 mg | ORAL_TABLET | Freq: Every day | ORAL | 0 refills | Status: DC
  Filled 2021-06-04: qty 6, 3d supply, fill #0

## 2021-06-04 MED ORDER — OZEMPIC (2 MG/DOSE) 8 MG/3ML ~~LOC~~ SOPN
2.0000 mg | PEN_INJECTOR | SUBCUTANEOUS | 1 refills | Status: DC
  Filled 2021-06-04 – 2021-06-18 (×2): qty 9, 84d supply, fill #0
  Filled 2021-09-22 – 2021-10-02 (×2): qty 9, 84d supply, fill #1

## 2021-06-04 MED ORDER — RIVAROXABAN 20 MG PO TABS
20.0000 mg | ORAL_TABLET | Freq: Every day | ORAL | 0 refills | Status: DC
  Filled 2021-06-04: qty 90, 90d supply, fill #0

## 2021-06-04 NOTE — Patient Instructions (Signed)
Flank Pain, Adult ?Flank pain is pain in your side. The flank is the area on your side between your upper belly (abdomen) and your spine. The pain may occur over a short time (acute), or it may be long-term or come back often (chronic). It may be mild or very bad. Pain in this area can be caused by many different things. ?Follow these instructions at home: ? ?Drink enough fluid to keep your pee (urine) pale yellow. ?Rest as told by your doctor. ?Take over-the-counter and prescription medicines only as told by your doctor. ?Keep a journal to keep track of: ?What has caused your flank pain. ?What has made your flank pain feel better. ?Keep all follow-up visits. ?Contact a doctor if: ?Medicine does not help your pain. ?You have new symptoms. ?Your pain gets worse. ?Your symptoms last longer than 2-3 days. ?You have trouble peeing. ?You are peeing more often than normal. ?Get help right away if: ?You have trouble breathing. ?You are short of breath. ?Your belly hurts, or it is swollen or red. ?You feel like you may vomit (nauseous). ?You vomit. ?You feel faint, or you faint. ?You have blood in your pee. ?You have flank pain and a fever. ?These symptoms may be an emergency. Get help right away. Call your local emergency services (911 in the [place] ?Do not wait to see if the symptoms will go away. ?Do not drive yourself to the hospital. ?Summary ?Flank pain is pain in your side. The flank is the area of your side between your upper belly (abdomen) and your spine. ?Flank pain may occur over a short time (acute), or it may be long-term or come back often (chronic). It may be mild or very bad. ?Pain in this area can be caused by many different things. ?Contact your doctor if your symptoms get worse or last longer than 2-3 days. ?This information is not intended to replace advice given to you by your health care provider. Make sure you discuss any questions you have with your health care provider. ?Document Revised:  07/01/2020 Document Reviewed: 07/01/2020 ?Elsevier Patient Education ? 2022 Else[employer] Inc[employer] ? ?

## 2021-06-04 NOTE — Progress Notes (Signed)
I,Kristi Frank,acting as a Education administrator for [employer], FNP.,have documented all relevant documentation on the behalf of Kristi Brine, FNP,as directed by  Kristi Brine, FNP while in the presence of Kristi Frank, [place].  This visit occurred during the SARS-CoV-2 public health emergency.  Safety protocols were in place, including screening questions prior to the visit, additional usage of staff PPE, and extensive cleaning of exam room while observing appropriate contact time as indicated for disinfecting solutions.  Subjective:     Patient ID: Kristi Frank , female    DOB: 06/14/1975 , 46 y.o.   MRN: [id number]   Chief Complaint  Patient presents with   Flank Pain    HPI  Patient is here for flank pain. She states the pain is more on the right side than the left. Initially felt on a Thursday but then went away. 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 She used epsom salt bath and heating pad and took a tylenol during the night due to the pain. 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 After having the pain she vomited into early Monday morning, has not been able to eat since. Pain currently 3/10. She reports she drinks approximately 64 oz water, she does drink tea and occasional soda. Reports the pain last all day.   Flank Pain This is a new problem. The current episode started 1 to 4 weeks ago (2 weeks ago). The pain is at a severity of 3/10. The pain is mild. Worse during: varies. Pertinent negatives include no abdominal pain, dysuria, headaches or numbness. Risk factors include sedentary lifestyle. She has tried analgesics for the symptoms. The treatment provided mild relief.    Past Medical History:  Diagnosis Date   Anxiety    Blood clotting disorder ([place])    Migraine    Prediabetes      Family  History  Problem Relation Age of Onset   Epilepsy Mother    Hypertension Mother    Healthy Father    Diabetes Maternal Aunt    Cancer Paternal Uncle    Healthy Maternal Grandmother    Dementia Paternal Grandmother    Heart disease Paternal Grandfather    Clotting disorder Other      Current Outpatient Medications:    metFORMIN (GLUCOPHAGE-XR) 500 MG 24 hr tablet, Take 2 tablets (1,000 mg total) by mouth 2 (two) times daily with meals, Disp: 360 tablet, Rfl: 0   ondansetron (ZOFRAN) 4 MG tablet, Take 1 tablet (4 mg total) by mouth daily as needed for nausea or vomiting., Disp: 30 tablet, Rfl: 1   predniSONE (DELTASONE) 20 MG tablet, Take 2 tablets (40 mg total) by mouth daily., Disp: 6 tablet, Rfl: 0   clobetasol ointment (TEMOVATE) 3.79 %, Apply 1 application topically 2 (two) times daily., Disp: 30 g, Rfl: 2   rivaroxaban (XARELTO) 20 MG TABS tablet, Take 1 tablet (20 mg total) by mouth daily with dinner, Disp: 90 tablet, Rfl: 0   Semaglutide, 2 MG/DOSE, (OZEMPIC, 2 MG/DOSE,) 8 MG/3ML SOPN, Inject 2 mg into the skin once a week., Disp: 9 mL, Rfl: 1   No Known Allergies   Review of Systems  Constitutional: Negative.   Respiratory: Negative.    Cardiovascular: Negative.   Gastrointestinal: Negative.  Negative  for abdominal pain.  Genitourinary:  Positive for flank pain. Negative for dysuria.  Neurological: Negative.  Negative for numbness and headaches.  Psychiatric/Behavioral: Negative.      Today's Vitals   06/04/21 0927  BP: 130/70  Pulse: 86  Temp: 97.9 F (36.6 C)  TempSrc: Oral  Weight: 225 lb (102.1 kg)  Height: 5' 5.8" (1.671 m)   Body mass index is 36.54 kg/m.  Wt Readings from Last 3 Encounters:  06/04/21 225 lb (102.1 kg)  02/10/21 231 lb 9.6 oz (105.1 kg)  11/11/20 223 lb 6.4 oz (101.3 kg)    Objective:  Physical Exam Vitals reviewed.  Constitutional:      General: She is not in acute distress.    Appearance: Normal appearance. She is obese.   Cardiovascular:     Rate and Rhythm: Normal rate and regular rhythm.     Pulses: Normal pulses.     Heart sounds: Normal heart sounds. No murmur heard. Pulmonary:     Effort: Pulmonary effort is normal. No respiratory distress.     Breath sounds: Normal breath sounds. No wheezing.  Abdominal:     Tenderness: There is no right CVA tenderness or left CVA tenderness.  Musculoskeletal:        General: No swelling or tenderness. Normal range of motion.     Comments: Negative straight leg raise  Neurological:     General: No focal deficit present.     Mental Status: She is alert and oriented to person, place, and time.     Cranial Nerves: No cranial nerve deficit.     Motor: No weakness.  Psychiatric:        Mood and Affect: Mood normal.        Behavior: Behavior normal.        Thought Content: Thought content normal.        Judgment: Judgment normal.        Assessment And Plan:     1. Flank pain Comments: Urinalysis is normal. Will check Renal ULS to check for kidney stones vs sciatic nerve pain. Short course of steroids given.  - POCT Urinalysis Dipstick (81002) - US Renal; Future - predniSONE (DELTASONE) 20 MG tablet; Take 2 tablets (40 mg total) by mouth daily.  Dispense: 6 tablet; Refill: 0  2. Nausea and vomiting, unspecified vomiting type Comments: Zofran given as needed - ondansetron (ZOFRAN) 4 MG tablet; Take 1 tablet (4 mg total) by mouth daily as needed for nausea or vomiting.  Dispense: 30 tablet; Refill: 1  3. Prediabetes Comments: She has been taking Ozempic 1 mg weekly and metformin, will increase dose to 2 mg weekly. Requested records from previous provider - Semaglutide, 2 MG/DOSE, (OZEMPIC, 2 MG/DOSE,) 8 MG/3ML SOPN; Inject 2 mg into the skin once a week.  Dispense: 9 mL; Refill: 1  4. Class 2 obesity due to excess calories without serious comorbidity with body mass index (BMI) of 36.0 to 36.9 in adult She is encouraged to strive for BMI less than 30 to  decrease cardiac risk. Advised to aim for at least 150 minutes of exercise per week. She has lost approximately 5 lbs since her last visit  5. History of pulmonary embolism Comments: Had total of 4 PE's while living in [place], referral placed to Hematology in October 2022 she will call for appt to [facility]. - rivaroxaban (XARELTO) 20 MG TABS tablet; Take 1 tablet (20 mg total) by mouth daily with dinner  Dispense: 90 tablet; Refill: 0  Patient was given opportunity to ask questions. Patient verbalized understanding of the plan and was able to repeat key elements of the plan. All questions were answered to their satisfaction.  Kristi Brine, FNP   I, Kristi Brine, FNP, have reviewed all documentation for this visit. The documentation on 06/04/21 for the exam, diagnosis, procedures, and orders are all accurate and complete.   IF YOU HAVE BEEN REFERRED TO A SPECIALIST, IT MAY TAKE 1-2 WEEKS TO SCHEDULE/PROCESS THE REFERRAL. IF YOU HAVE NOT HEARD FROM US/SPECIALIST IN TWO WEEKS, PLEASE GIVE [place] A CALL AT [phone].   THE PATIENT IS ENCOURAGED TO PRACTICE SOCIAL DISTANCING DUE TO THE COVID-19 PANDEMIC.

## 2021-06-05 ENCOUNTER — Ambulatory Visit (HOSPITAL_BASED_OUTPATIENT_CLINIC_OR_DEPARTMENT_OTHER)
Admission: RE | Admit: 2021-06-05 | Discharge: 2021-06-05 | Disposition: A | Discharge status: Home / Self Care | Payer: No Typology Code available for payment source | Source: Ambulatory Visit | Attending: Nurse Practitioner | Admitting: Nurse Practitioner

## 2021-06-05 DIAGNOSIS — R109 Unspecified abdominal pain: Secondary | ICD-10-CM | POA: Diagnosis present

## 2021-06-17 ENCOUNTER — Other Ambulatory Visit (HOSPITAL_COMMUNITY): Payer: Self-pay

## 2021-06-17 ENCOUNTER — Encounter: Payer: Self-pay | Admitting: Nurse Practitioner

## 2021-06-17 ENCOUNTER — Other Ambulatory Visit: Payer: Self-pay

## 2021-06-17 ENCOUNTER — Ambulatory Visit (INDEPENDENT_AMBULATORY_CARE_PROVIDER_SITE_OTHER): Payer: No Typology Code available for payment source | Admitting: Nurse Practitioner

## 2021-06-17 VITALS — BP 122/88 | HR 85 | Temp 98.1°F | Ht 65.0 in | Wt 224.0 lb

## 2021-06-17 DIAGNOSIS — Z0000 Encounter for general adult medical examination without abnormal findings: Secondary | ICD-10-CM | POA: Diagnosis not present

## 2021-06-17 DIAGNOSIS — R109 Unspecified abdominal pain: Secondary | ICD-10-CM | POA: Diagnosis not present

## 2021-06-17 DIAGNOSIS — E6609 Other obesity due to excess calories: Secondary | ICD-10-CM

## 2021-06-17 DIAGNOSIS — R7303 Prediabetes: Secondary | ICD-10-CM

## 2021-06-17 DIAGNOSIS — Z6836 Body mass index (BMI) 36.0-36.9, adult: Secondary | ICD-10-CM

## 2021-06-17 MED ORDER — METHOCARBAMOL 500 MG PO TABS
500.0000 mg | ORAL_TABLET | Freq: Four times a day (QID) | ORAL | 1 refills | Status: DC
  Filled 2021-06-17: qty 30, 8d supply, fill #0
  Filled 2021-07-04: qty 30, 8d supply, fill #1

## 2021-06-17 NOTE — Patient Instructions (Signed)

## 2021-06-17 NOTE — Progress Notes (Signed)
I,Victoria T Hamilton,acting as a Education administrator for [name], FNP.,have documented all relevant documentation on the behalf of [name], FNP,as directed by  [name], FNP while in the presence of [name], [place].   This visit occurred during the SARS-CoV-2 public health emergency.  Safety protocols were in place, including screening questions prior to the visit, additional usage of staff PPE, and extensive cleaning of exam room while observing appropriate contact time as indicated for disinfecting solutions.  Subjective:     Patient ID: Kristi Frank , female    DOB: Mar 31, 1976 , 46 y.o.   MRN: [id number]   Chief Complaint  Patient presents with   Annual Exam    HPI  Pt presents today for HM with pap. Pt wants to discuss a little more about her renal side pain, on left side.     Past Medical History:  Diagnosis Date   Anxiety    Blood clotting disorder ([place])    Migraine    Prediabetes      Family History  Problem Relation Age of Onset   Epilepsy Mother    Hypertension Mother    Healthy Father    Diabetes Maternal Aunt    Cancer Paternal Uncle    Healthy Maternal Grandmother    Dementia Paternal Grandmother    Heart disease Paternal Grandfather    Clotting disorder Other      Current Outpatient Medications:    clobetasol ointment (TEMOVATE) 6.22 %, Apply 1 application topically 2 (two) times daily., Disp: 30 g, Rfl: 2   metFORMIN (GLUCOPHAGE-XR) 500 MG 24 hr tablet, Take 2 tablets (1,000 mg total) by mouth 2 (two) times daily with meals, Disp: 360 tablet, Rfl: 0   methocarbamol (ROBAXIN) 500 MG tablet, Take 1 tablet (500 mg total) by mouth 4 (four) times daily., Disp: 30 tablet, Rfl: 1   ondansetron (ZOFRAN) 4 MG tablet, Take 1 tablet (4 mg total) by mouth daily as needed for nausea or vomiting., Disp: 30 tablet, Rfl: 1   rivaroxaban (XARELTO) 20 MG TABS tablet, Take 1 tablet (20 mg total) by mouth daily with dinner, Disp: 90 tablet, Rfl: 0   Semaglutide, 2  MG/DOSE, (OZEMPIC, 2 MG/DOSE,) 8 MG/3ML SOPN, Inject 2 mg into the skin once a week., Disp: 9 mL, Rfl: 1   predniSONE (DELTASONE) 20 MG tablet, Take 2 tablets (40 mg total) by mouth daily. (Patient not taking: Reported on 06/17/2021), Disp: 6 tablet, Rfl: 0   No Known Allergies    The patient states she is status post hysterectomy. No LMP recorded. Patient has had a hysterectomy..  Negative for: breast discharge, breast lump(s), breast pain and breast self exam. Associated symptoms include abnormal vaginal bleeding. Pertinent negatives include abnormal bleeding (hematology), anxiety, decreased libido, depression, difficulty falling sleep, dyspareunia, history of infertility, nocturia, sexual dysfunction, sleep disturbances, urinary incontinence, urinary urgency, vaginal discharge and vaginal itching. Diet regular; admits she has been doing better in the last 2 weeks, has been cutting back on snacks. She has been trying to meal prep. She has not had fast food in 2 weeks. She has been drinking more water. The patient states her exercise level is minimal - she walks through the storeroom at work.  Saturday she has gone to get her key fob for the gym at her apartment complex.   The patient's tobacco use is:  Social History   Tobacco Use  Smoking Status Never  Smokeless Tobacco Never   She has been exposed to passive smoke.  The patient's alcohol use is:  Social History   Substance and Sexual Activity  Alcohol Use Yes   Comment: socially     Review of Systems  Constitutional: Negative.   HENT: Negative.    Eyes: Negative.   Respiratory: Negative.    Cardiovascular: Negative.  Negative for chest pain, palpitations and leg swelling.  Gastrointestinal: Negative.   Endocrine: Negative.   Genitourinary: Negative.   Musculoskeletal: Negative.        Right flank pain with movement  Skin: Negative.   Allergic/Immunologic: Negative.   Neurological: Negative.   Hematological: Negative.    Psychiatric/Behavioral: Negative.      Today's Vitals   06/17/21 1501  BP: 122/88  Pulse: 85  Temp: 98.1 F (36.7 C)  Weight: 224 lb (101.6 kg)  Height: $Remove'5\' 5"'CxQcJYA$  (1.651 m)   Body mass index is 37.28 kg/m.  Wt Readings from Last 3 Encounters:  06/17/21 224 lb (101.6 kg)  06/04/21 225 lb (102.1 kg)  02/10/21 231 lb 9.6 oz (105.1 kg)    Objective:  Physical Exam Vitals reviewed.  Constitutional:      General: She is not in acute distress.    Appearance: Normal appearance. She is well-developed. She is obese.  HENT:     Head: Normocephalic and atraumatic.     Right Ear: Hearing, tympanic membrane, ear canal and external ear normal. There is no impacted cerumen.     Left Ear: Hearing, tympanic membrane, ear canal and external ear normal. There is no impacted cerumen.     Nose:     Comments: Deferred - masked    Mouth/Throat:     Comments: Deferred - masked Eyes:     General: Lids are normal.     Extraocular Movements: Extraocular movements intact.     Conjunctiva/sclera: Conjunctivae normal.     Pupils: Pupils are equal, round, and reactive to light.     Funduscopic exam:    Right eye: No papilledema.        Left eye: No papilledema.  Neck:     Thyroid: No thyroid mass.     Vascular: No carotid bruit.  Cardiovascular:     Rate and Rhythm: Normal rate and regular rhythm.     Pulses: Normal pulses.     Heart sounds: Normal heart sounds. No murmur heard. Pulmonary:     Effort: Pulmonary effort is normal. No respiratory distress.     Breath sounds: Normal breath sounds. No wheezing.  Chest:     Chest wall: No mass.  Breasts:    Tanner Score is 5.     Right: Normal. No mass or tenderness.     Left: Normal. No mass or tenderness.  Abdominal:     General: Abdomen is flat. Bowel sounds are normal. There is no distension.     Palpations: Abdomen is soft.     Tenderness: There is no abdominal tenderness.  Musculoskeletal:        General: Tenderness (right flank area)  present. No swelling. Normal range of motion.     Cervical back: Full passive range of motion without pain, normal range of motion and neck supple.     Right lower leg: No edema.     Left lower leg: No edema.  Lymphadenopathy:     Upper Body:     Right upper body: No supraclavicular, axillary or pectoral adenopathy.     Left upper body: No supraclavicular, axillary or pectoral adenopathy.  Skin:    General: Skin is  warm and dry.     Capillary Refill: Capillary refill takes less than 2 seconds.  Neurological:     General: No focal deficit present.     Mental Status: She is alert and oriented to person, place, and time.     Cranial Nerves: No cranial nerve deficit.     Sensory: No sensory deficit.     Motor: No weakness.  Psychiatric:        Mood and Affect: Mood normal.        Behavior: Behavior normal.        Thought Content: Thought content normal.        Judgment: Judgment normal.        Assessment And Plan:     1. Encounter for annual physical exam Behavior modifications discussed and diet history reviewed.   Pt will continue to exercise regularly and modify diet with low GI, plant based foods and decrease intake of processed foods.  Recommend intake of daily multivitamin, Vitamin D, and calcium.  Recommend mammogram (given number for [facility] in October) for preventive screenings, as well as recommend immunizations that include influenza, TDAP  2. Class 2 obesity due to excess calories without serious comorbidity with body mass index (BMI) of 36.0 to 36.9 in adult She is encouraged to strive for BMI less than 30 to decrease cardiac risk. Discussed healthy diet and regular exercise options. Encouraged to exercise at least 150 minutes per week with 2 days of strength training  3. Prediabetes Comments: Stable, diet controlled. Encouraged to exercise at least 150 minutes a week and eat healthy diet low in sugar and carbohydrates.  - CMP14+EGFR - CBC - Hemoglobin  A1c - Lipid panel - POCT Urinalysis Dipstick (81002) - Microalbumin / creatinine urine ratio  4. Flank pain Comments: Tenderness to right flank area, will treat with muscle relaxer, urinalysis is normal.  - methocarbamol (ROBAXIN) 500 MG tablet; Take 1 tablet (500 mg total) by mouth 4 (four) times daily.  Dispense: 30 tablet; Refill: 1     Patient was given opportunity to ask questions. Patient verbalized understanding of the plan and was able to repeat key elements of the plan. All questions were answered to their satisfaction.   [name], FNP   I, [name], FNP, have reviewed all documentation for this visit. The documentation on 06/17/21 for the exam, diagnosis, procedures, and orders are all accurate and complete.   THE PATIENT IS ENCOURAGED TO PRACTICE SOCIAL DISTANCING DUE TO THE COVID-19 PANDEMIC.

## 2021-06-18 ENCOUNTER — Other Ambulatory Visit (HOSPITAL_COMMUNITY): Payer: Self-pay

## 2021-06-18 LAB — CMP14+EGFR
ALT: 9 IU/L (ref 0–32)
AST: 12 IU/L (ref 0–40)
Albumin/Globulin Ratio: 1.5 (ref 1.2–2.2)
Albumin: 4.5 g/dL (ref 3.8–4.8)
Alkaline Phosphatase: 84 IU/L (ref 44–121)
BUN/Creatinine Ratio: 13 (ref 9–23)
BUN: 8 mg/dL (ref 6–24)
Bilirubin Total: 0.3 mg/dL (ref 0.0–1.2)
CO2: 25 mmol/L (ref 20–29)
Calcium: 11.7 mg/dL — ABNORMAL HIGH (ref 8.7–10.2)
Chloride: 101 mmol/L (ref 96–106)
Creatinine, Ser: 0.63 mg/dL (ref 0.57–1.00)
Globulin, Total: 3.1 g/dL (ref 1.5–4.5)
Glucose: 82 mg/dL (ref 70–99)
Potassium: 4.5 mmol/L (ref 3.5–5.2)
Sodium: 138 mmol/L (ref 134–144)
Total Protein: 7.6 g/dL (ref 6.0–8.5)
eGFR: 112 mL/min/{1.73_m2} (ref >59)

## 2021-06-18 LAB — CBC
Hematocrit: 39.7 % (ref 34.0–46.6)
Hemoglobin: 13.4 g/dL (ref 11.1–15.9)
MCH: 29.3 pg (ref 26.6–33.0)
MCHC: 33.8 g/dL (ref 31.5–35.7)
MCV: 87 fL (ref 79–97)
Platelets: 281 10*3/uL (ref 150–450)
RBC: 4.58 x10E6/uL (ref 3.77–5.28)
RDW: 13.2 % (ref 11.7–15.4)
WBC: 4.9 10*3/uL (ref 3.4–10.8)

## 2021-06-18 LAB — HEMOGLOBIN A1C
Est. average glucose Bld gHb Est-mCnc: 120 mg/dL
Hgb A1c MFr Bld: 5.8 % — ABNORMAL HIGH (ref 4.8–5.6)

## 2021-06-18 LAB — LIPID PANEL
Chol/HDL Ratio: 3.1 ratio (ref 0.0–4.4)
Cholesterol, Total: 184 mg/dL (ref 100–199)
HDL: 59 mg/dL (ref >39)
LDL Chol Calc (NIH): 112 mg/dL — ABNORMAL HIGH (ref 0–99)
Triglycerides: 71 mg/dL (ref 0–149)
VLDL Cholesterol Cal: 13 mg/dL (ref 5–40)

## 2021-07-04 ENCOUNTER — Other Ambulatory Visit (HOSPITAL_COMMUNITY): Payer: Self-pay

## 2021-08-28 ENCOUNTER — Other Ambulatory Visit: Payer: Self-pay | Admitting: Nurse Practitioner

## 2021-08-28 DIAGNOSIS — R109 Unspecified abdominal pain: Secondary | ICD-10-CM

## 2021-08-29 ENCOUNTER — Other Ambulatory Visit (HOSPITAL_COMMUNITY): Payer: Self-pay

## 2021-08-29 MED ORDER — METHOCARBAMOL 500 MG PO TABS
500.0000 mg | ORAL_TABLET | Freq: Four times a day (QID) | ORAL | 1 refills | Status: DC
  Filled 2021-08-29: qty 30, 8d supply, fill #0

## 2021-09-03 ENCOUNTER — Other Ambulatory Visit (HOSPITAL_COMMUNITY): Payer: Self-pay

## 2021-09-05 ENCOUNTER — Other Ambulatory Visit: Payer: Self-pay | Admitting: Nurse Practitioner

## 2021-09-08 ENCOUNTER — Other Ambulatory Visit (HOSPITAL_COMMUNITY): Payer: Self-pay

## 2021-09-08 MED ORDER — METFORMIN HCL ER 500 MG PO TB24
1000.0000 mg | ORAL_TABLET | Freq: Two times a day (BID) | ORAL | 0 refills | Status: DC
  Filled 2021-09-08: qty 360, 90d supply, fill #0

## 2021-09-09 ENCOUNTER — Other Ambulatory Visit (HOSPITAL_COMMUNITY): Payer: Self-pay

## 2021-09-22 ENCOUNTER — Other Ambulatory Visit (HOSPITAL_COMMUNITY): Payer: Self-pay

## 2021-10-01 ENCOUNTER — Other Ambulatory Visit (HOSPITAL_COMMUNITY): Payer: Self-pay

## 2021-10-02 ENCOUNTER — Other Ambulatory Visit (HOSPITAL_COMMUNITY): Payer: Self-pay

## 2021-10-07 ENCOUNTER — Encounter: Payer: Self-pay | Admitting: Nurse Practitioner

## 2021-11-10 ENCOUNTER — Ambulatory Visit: Payer: No Typology Code available for payment source | Admitting: Nurse Practitioner

## 2021-12-16 ENCOUNTER — Other Ambulatory Visit (HOSPITAL_COMMUNITY): Payer: Self-pay

## 2021-12-16 ENCOUNTER — Ambulatory Visit: Payer: BC Managed Care – PPO | Admitting: Nurse Practitioner

## 2021-12-16 ENCOUNTER — Encounter: Payer: Self-pay | Admitting: Nurse Practitioner

## 2021-12-16 VITALS — BP 120/68 | HR 68 | Temp 98.0°F | Ht 65.0 in | Wt 247.0 lb

## 2021-12-16 DIAGNOSIS — E6609 Other obesity due to excess calories: Secondary | ICD-10-CM

## 2021-12-16 DIAGNOSIS — R7303 Prediabetes: Secondary | ICD-10-CM

## 2021-12-16 DIAGNOSIS — Z6836 Body mass index (BMI) 36.0-36.9, adult: Secondary | ICD-10-CM | POA: Diagnosis not present

## 2021-12-16 DIAGNOSIS — Z1231 Encounter for screening mammogram for malignant neoplasm of breast: Secondary | ICD-10-CM

## 2021-12-16 MED ORDER — WEGOVY 0.25 MG/0.5ML ~~LOC~~ SOAJ
0.2500 mg | SUBCUTANEOUS | 0 refills | Status: DC
  Filled 2021-12-16 – 2021-12-23 (×3): qty 2, 28d supply, fill #0

## 2021-12-16 NOTE — Progress Notes (Signed)
Barnet Glasgow Martin,acting as a Education administrator for [name], FNP.,have documented all relevant documentation on the behalf of [name], FNP,as directed by  [name], FNP while in the presence of [name], [place].    Subjective:     Patient ID: Kristi Frank , female    DOB: 10-15-1975 , 46 y.o.   MRN: [id number]   Chief Complaint  Patient presents with   Weight Check    HPI  Patient presents today for a weight check. Patient states compliance with medications. Patient states her ozempic is no longer covered and she wants to try wegovy now.   BP Readings from Last 3 Encounters: 12/16/21 : 120/68 06/17/21 : 122/88 06/04/21 : 130/70  Wt Readings from Last 3 Encounters: 12/16/21 : 247 lb (112 kg) 06/17/21 : 224 lb (101.6 kg) 06/04/21 : 225 lb (102.1 kg)  The last time she had ozempic for prediabetes was in May. She has joined the gym. She is working a new position and is working late, working on Saturdays and Sundays. Admits her diet is "horrible". She likes to eat sweets. She will even order from [employer] in the middle of the night to get the "sugar foods". She has tried orlistat in the past, had increased side effects of oily discharge. She is now getting a lyft to work and will take the bus home so she walks a little more.   Family history of cardiac disease - uncle just passed from heart disease.       Past Medical History:  Diagnosis Date   Anxiety    Blood clotting disorder ([place])    Migraine    Prediabetes      Family History  Problem Relation Age of Onset   Epilepsy Mother    Hypertension Mother    Healthy Father    Diabetes Maternal Aunt    Cancer Paternal Uncle    Healthy Maternal Grandmother    Dementia Paternal Grandmother    Heart disease Paternal Grandfather    Clotting disorder Other      Current Outpatient Medications:    clobetasol ointment (TEMOVATE) 8.09 %, Apply 1 application topically 2 (two) times daily., Disp: 30 g, Rfl: 2   metFORMIN  (GLUCOPHAGE-XR) 500 MG 24 hr tablet, Take 2 tablets (1,000 mg total) by mouth 2 (two) times daily with meals, Disp: 360 tablet, Rfl: 0   methocarbamol (ROBAXIN) 500 MG tablet, Take 1 tablet (500 mg total) by mouth 4 (four) times daily., Disp: 30 tablet, Rfl: 1   ondansetron (ZOFRAN) 4 MG tablet, Take 1 tablet (4 mg total) by mouth daily as needed for nausea or vomiting., Disp: 30 tablet, Rfl: 1   rivaroxaban (XARELTO) 20 MG TABS tablet, Take 1 tablet (20 mg total) by mouth daily with dinner, Disp: 90 tablet, Rfl: 0   Semaglutide-Weight Management (WEGOVY) 0.25 MG/0.5ML SOAJ, Inject 0.25 mg into the skin once a week., Disp: 2 mL, Rfl: 0   No Known Allergies   Review of Systems  Constitutional: Negative.   HENT: Negative.    Eyes: Negative.   Respiratory: Negative.    Cardiovascular: Negative.   Gastrointestinal: Negative.   Psychiatric/Behavioral: Negative.       Today's Vitals   12/16/21 1633  BP: 120/68  Pulse: 68  Temp: 98 F (36.7 C)  TempSrc: Oral  Weight: 247 lb (112 kg)  Height: $Remove'5\' 5"'EtzDKkP$  (1.651 m)  PainSc: 0-No pain   Body mass index is 41.1 kg/m.  Wt Readings from Last  3 Encounters:  12/16/21 247 lb (112 kg)  06/17/21 224 lb (101.6 kg)  06/04/21 225 lb (102.1 kg)     Objective:  Physical Exam Vitals reviewed.  Constitutional:      General: She is not in acute distress.    Appearance: Normal appearance. She is obese.  Cardiovascular:     Rate and Rhythm: Normal rate and regular rhythm.     Pulses: Normal pulses.     Heart sounds: Normal heart sounds. No murmur heard. Pulmonary:     Effort: Pulmonary effort is normal. No respiratory distress.     Breath sounds: Normal breath sounds. No wheezing.  Abdominal:     Tenderness: There is no right CVA tenderness or left CVA tenderness.  Neurological:     General: No focal deficit present.     Mental Status: She is alert and oriented to person, place, and time.     Cranial Nerves: No cranial nerve deficit.      Motor: No weakness.  Psychiatric:        Mood and Affect: Mood normal.        Behavior: Behavior normal.        Thought Content: Thought content normal.        Judgment: Judgment normal.         Assessment And Plan:     1. Prediabetes Comments: HgbA1c is improving, continue focusing on healthy diet low in sugar and starches. - Hemoglobin A1c - BMP8+eGFR  2. Class 2 obesity due to excess calories without serious comorbidity with body mass index (BMI) of 36.0 to 36.9 in adult Comments: Discussed with her about discipline and being able to not eat certain foods. we have started [facility], discussed side effects to include nausea, difficulty swallowing and abdominal pain. She is to titrate weekly as tolerated. Goal to lose 10% body weight in 4 months if approved by insurance - Semaglutide-Weight Management (WEGOVY) 0.25 MG/0.5ML SOAJ; Inject 0.25 mg into the skin once a week.  Dispense: 2 mL; Refill: 0  3. Encounter for screening mammogram for breast cancer Pt instructed on Self Breast Exam.According to ACOG guidelines Women aged 39 and older are recommended to get an annual mammogram. Form completed and given to patient [facility] the [facility] for appointment scheduing.  Pt encouraged to get annual mammogram - MM Digital Screening; Future   Discussed her cardiac risk due to increased family history of cardiac disease. Her ASCVD score is 0.6%. We discussed having a calcium risk score done she would like to wait for this test since it is cash pay  The 10-year ASCVD risk score (Arnett DK, et al., 2019) is: 0.6%   Values used to calculate the score:     Age: 74 years     Sex: Female     Is Non-Hispanic African American: Yes     Diabetic: No     Tobacco smoker: No     Systolic Blood Pressure: 120 mmHg     Is BP treated: No     HDL Cholesterol: 59 mg/dL     Total Cholesterol: 184 mg/dL   Patient was given opportunity to ask questions. Patient verbalized understanding of the plan  and was able to repeat key elements of the plan. All questions were answered to their satisfaction.  Arnette Felts, FNP   I, Arnette Felts, FNP, have reviewed all documentation for this visit. The documentation on 12/16/21 for the exam, diagnosis, procedures, and orders are all accurate and complete.  IF YOU HAVE BEEN REFERRED TO A SPECIALIST, IT MAY TAKE 1-2 WEEKS TO SCHEDULE/PROCESS THE REFERRAL. IF YOU HAVE NOT HEARD FROM US/SPECIALIST IN TWO WEEKS, PLEASE GIVE [place] A CALL AT [phone].   THE PATIENT IS ENCOURAGED TO PRACTICE SOCIAL DISTANCING DUE TO THE COVID-19 PANDEMIC.

## 2021-12-16 NOTE — Patient Instructions (Signed)
Obesity, Adult ?Obesity is having too much body fat. Being obese means that your weight is more than what is healthy for you.  ?BMI (body mass index) is a number that explains how much body fat you have. If you have a BMI of 30 or more, you are obese. ?Obesity can cause serious health problems, such as: ?Stroke. ?Coronary artery disease (CAD). ?Type 2 diabetes. ?Some types of cancer. ?High blood pressure (hypertension). ?High cholesterol. ?Gallbladder stones. ?Obesity can also contribute to: ?Osteoarthritis. ?Sleep apnea. ?Infertility problems. ?What are the causes? ?Eating meals each day that are high in calories, sugar, and fat. ?Drinking a lot of drinks that have sugar in them. ?Being born with genes that may make you more likely to become obese. ?Having a medical condition that causes obesity. ?Taking certain medicines. ?Sitting a lot (having a sedentary lifestyle). ?Not getting enough sleep. ?What increases the risk? ?Having a family history of obesity. ?Living in an area with limited access to: ?Parks, recreation centers, or sidewalks. ?Healthy food choices, such as grocery stores and farmers' markets. ?What are the signs or symptoms? ?The main sign is having too much body fat. ?How is this treated? ?Treatment for this condition often includes changing your lifestyle. Treatment may include: ?Changing your diet. This may include making a healthy meal plan. ?Exercise. This may include activity that causes your heart to beat faster (aerobic exercise) and strength training. Work with your doctor to design a program that works for you. ?Medicine to help you lose weight. This may be used if you are not able to lose one pound a week after 6 weeks of healthy eating and more exercise. ?Treating conditions that cause the obesity. ?Surgery. Options may include gastric banding and gastric bypass. This may be done if: ?Other treatments have not helped to improve your condition. ?You have a BMI of 40 or higher. ?You have  life-threatening health problems related to obesity. ?Follow these instructions at home: ?Eating and drinking ? ?Follow advice from your doctor about what to eat and drink. Your doctor may tell you to: ?Limit fast food, sweets, and processed snack foods. ?Choose low-fat options. For example, choose low-fat milk instead of whole milk. ?Eat five or more servings of fruits or vegetables each day. ?Eat at home more often. This gives you more control over what you eat. ?Choose healthy foods when you eat out. ?Learn to read food labels. This will help you learn how much food is in one serving. ?Keep low-fat snacks available. ?Avoid drinks that have a lot of sugar in them. These include soda, fruit juice, iced tea with sugar, and flavored milk. ?Drink enough water to keep your pee (urine) pale yellow. ?Do not go on fad diets. ?Physical activity ?Exercise often, as told by your doctor. Most adults should get up to 150 minutes of moderate-intensity exercise every week.Ask your doctor: ?What types of exercise are safe for you. ?How often you should exercise. ?Warm up and stretch before being active. ?Do slow stretching after being active (cool down). ?Rest between times of being active. ?Lifestyle ?Work with your doctor and a food expert (dietitian) to set a weight-loss goal that is best for you. ?Limit your screen time. ?Find ways to reward yourself that do not involve food. ?Do not drink alcohol if: ?Your doctor tells you not to drink. ?You are pregnant, may be pregnant, or are planning to become pregnant. ?If you drink alcohol: ?Limit how much you have to: ?0-1 drink a day for women. ?0-2 drinks   a day for men. ?Know how much alcohol is in your drink. In the [place] one drink equals one 12 oz bottle of beer (355 mL), one 5 oz glass of wine (148 mL), or one 1? oz glass of hard liquor (44 mL). ?General instructions ?Keep a weight-loss journal. This can help you keep track of: ?The food that you eat. ?How much exercise you  get. ?Take over-the-counter and prescription medicines only as told by your doctor. ?Take vitamins and supplements only as told by your doctor. ?Think about joining a support group. ?Pay attention to your mental health as obesity can lead to depression or self esteem issues. ?Keep all follow-up visits. ?Contact a doctor if: ?You cannot meet your weight-loss goal after you have changed your diet and lifestyle for 6 weeks. ?You are having trouble breathing. ?Summary ?Obesity is having too much body fat. ?Being obese means that your weight is more than what is healthy for you. ?Work with your doctor to set a weight-loss goal. ?Get regular exercise as told by your doctor. ?This information is not intended to replace advice given to you by your health care provider. Make sure you discuss any questions you have with your health care provider. ?Document Revised: 11/26/2020 Document Reviewed: 11/26/2020 ?Elsevier Patient Education ? 2023 [employer] ? ?

## 2021-12-17 ENCOUNTER — Other Ambulatory Visit (HOSPITAL_COMMUNITY): Payer: Self-pay

## 2021-12-17 LAB — BMP8+EGFR
BUN/Creatinine Ratio: 13 (ref 9–23)
BUN: 11 mg/dL (ref 6–24)
CO2: 24 mmol/L (ref 20–29)
Calcium: 10.9 mg/dL — ABNORMAL HIGH (ref 8.7–10.2)
Chloride: 104 mmol/L (ref 96–106)
Creatinine, Ser: 0.82 mg/dL (ref 0.57–1.00)
Glucose: 91 mg/dL (ref 70–99)
Potassium: 4.6 mmol/L (ref 3.5–5.2)
Sodium: 140 mmol/L (ref 134–144)
eGFR: 90 mL/min/{1.73_m2} (ref >59)

## 2021-12-17 LAB — HEMOGLOBIN A1C
Est. average glucose Bld gHb Est-mCnc: 120 mg/dL
Hgb A1c MFr Bld: 5.8 % — ABNORMAL HIGH (ref 4.8–5.6)

## 2021-12-23 ENCOUNTER — Other Ambulatory Visit (HOSPITAL_COMMUNITY): Payer: Self-pay

## 2021-12-24 ENCOUNTER — Telehealth: Payer: Self-pay

## 2021-12-24 ENCOUNTER — Other Ambulatory Visit (HOSPITAL_COMMUNITY): Payer: Self-pay

## 2021-12-24 NOTE — Telephone Encounter (Signed)
Pa completed for wegovy using CMM

## 2021-12-29 ENCOUNTER — Encounter: Payer: Self-pay | Admitting: Nurse Practitioner

## 2021-12-29 ENCOUNTER — Other Ambulatory Visit (HOSPITAL_COMMUNITY): Payer: Self-pay

## 2021-12-30 ENCOUNTER — Other Ambulatory Visit: Payer: Self-pay | Admitting: Nurse Practitioner

## 2022-01-01 ENCOUNTER — Other Ambulatory Visit: Payer: Self-pay | Admitting: Nurse Practitioner

## 2022-01-01 MED ORDER — SAXENDA 18 MG/3ML ~~LOC~~ SOPN
3.0000 mg | PEN_INJECTOR | Freq: Every day | SUBCUTANEOUS | 1 refills | Status: DC
  Filled 2022-01-01 – 2022-01-12 (×2): qty 15, 30d supply, fill #0

## 2022-01-02 ENCOUNTER — Other Ambulatory Visit (HOSPITAL_COMMUNITY): Payer: Self-pay

## 2022-01-08 ENCOUNTER — Other Ambulatory Visit (HOSPITAL_COMMUNITY): Payer: Self-pay

## 2022-01-12 ENCOUNTER — Other Ambulatory Visit (HOSPITAL_COMMUNITY): Payer: Self-pay

## 2022-01-20 ENCOUNTER — Other Ambulatory Visit (HOSPITAL_COMMUNITY): Payer: Self-pay

## 2022-01-22 ENCOUNTER — Telehealth: Payer: Self-pay

## 2022-01-22 ENCOUNTER — Other Ambulatory Visit (HOSPITAL_COMMUNITY): Payer: Self-pay

## 2022-01-22 NOTE — Telephone Encounter (Signed)
PA COMPLETED FOR [place]XENDA USING CMM.

## 2022-02-05 ENCOUNTER — Other Ambulatory Visit (HOSPITAL_COMMUNITY): Payer: Self-pay

## 2022-02-18 ENCOUNTER — Encounter: Payer: Self-pay | Admitting: Nurse Practitioner

## 2022-02-18 ENCOUNTER — Ambulatory Visit: Payer: BC Managed Care – PPO | Admitting: Nurse Practitioner

## 2022-02-18 VITALS — BP 120/62 | HR 82 | Temp 97.9°F | Ht 65.0 in | Wt 257.0 lb

## 2022-02-18 DIAGNOSIS — L290 Pruritus ani: Secondary | ICD-10-CM | POA: Diagnosis not present

## 2022-02-18 DIAGNOSIS — Z6841 Body Mass Index (BMI) 40.0 and over, adult: Secondary | ICD-10-CM

## 2022-02-18 DIAGNOSIS — Z23 Encounter for immunization: Secondary | ICD-10-CM

## 2022-02-18 DIAGNOSIS — M7660 Achilles tendinitis, unspecified leg: Secondary | ICD-10-CM

## 2022-02-18 DIAGNOSIS — R7303 Prediabetes: Secondary | ICD-10-CM

## 2022-02-18 MED ORDER — QSYMIA 7.5-46 MG PO CP24: 1.0000 | ORAL_CAPSULE | Freq: Every day | ORAL | 1 refills | Status: DC

## 2022-02-18 MED ORDER — NYSTATIN 100000 UNIT/GM EX CREA: 1.0000 | TOPICAL_CREAM | Freq: Two times a day (BID) | CUTANEOUS | 0 refills | Status: DC

## 2022-02-18 MED ORDER — QSYMIA 3.75-23 MG PO CP24: 1.0000 | ORAL_CAPSULE | Freq: Every day | ORAL | 0 refills | Status: DC

## 2022-02-18 NOTE — Progress Notes (Signed)
I,[name],acting as a Education administrator for [employer], FNP.,have documented all relevant documentation on the behalf of [name], FNP,as directed by  [name], FNP while in the presence of [name], [place].  Subjective:     Patient ID: Kristi Frank , female    DOB: 02/11/1976 , 46 y.o.   MRN: [id number]   Chief Complaint  Patient presents with   Weight Check    HPI  Patient presents today for a weight check. Patient states she is not doing the [place] because she never got it. She presents today to discuss other options. She has cut back on eating chips and sugars (cakes and cookies). Breakfast has been drinking a protein shake. She eats veggie burgers with peppers. She eats veggie straws. She is eating salmon and vegetables. This has been for 3 weeks. She has cut back on her coffee intake. She is not exercising she is working late hours. She is planning to start a "gym schedule" next month.   Wt Readings from Last 3 Encounters: 02/18/22 : 257 lb (116.6 kg) 12/16/21 : 247 lb (112 kg) 06/17/21 : 224 lb (101.6 kg)  Patient also has a bump on the back of her left foot, that hurts shes been feeling it for a while but it is now worse.   BP Readings from Last 3 Encounters: 02/18/22 : 120/62 12/16/21 : 120/68 06/17/21 : 122/88  Since stopping Ozempic she is urinating more.    Past Medical History:  Diagnosis Date   Anxiety    Blood clotting disorder ([place])    Migraine    Prediabetes      Family History  Problem Relation Age of Onset   Epilepsy Mother    Hypertension Mother    Healthy Father    Diabetes Maternal Aunt    Cancer Paternal Uncle    Healthy Maternal Grandmother    Dementia Paternal Grandmother    Heart disease Paternal Grandfather    Clotting disorder Other      Current Outpatient Medications:    clobetasol ointment (TEMOVATE) 6.94 %, Apply 1 application topically 2 (two) times daily., Disp: 30 g, Rfl: 2   metFORMIN (GLUCOPHAGE-XR) 500 MG 24 hr  tablet, Take 2 tablets (1,000 mg total) by mouth 2 (two) times daily with meals, Disp: 360 tablet, Rfl: 0   nystatin cream (MYCOSTATIN), Apply 1 Application topically 2 (two) times daily., Disp: 30 g, Rfl: 0   Phentermine-Topiramate (QSYMIA) 3.75-23 MG CP24, Take 1 tablet by mouth daily., Disp: 14 capsule, Rfl: 0   Phentermine-Topiramate (QSYMIA) 7.5-46 MG CP24, Take 1 tablet by mouth daily., Disp: 30 capsule, Rfl: 1   methocarbamol (ROBAXIN) 500 MG tablet, Take 1 tablet (500 mg total) by mouth 4 (four) times daily. (Patient not taking: Reported on 02/18/2022), Disp: 30 tablet, Rfl: 1   ondansetron (ZOFRAN) 4 MG tablet, Take 1 tablet (4 mg total) by mouth daily as needed for nausea or vomiting. (Patient not taking: Reported on 02/18/2022), Disp: 30 tablet, Rfl: 1   rivaroxaban (XARELTO) 20 MG TABS tablet, Take 1 tablet (20 mg total) by mouth daily with dinner (Patient not taking: Reported on 02/18/2022), Disp: 90 tablet, Rfl: 0   No Known Allergies   Review of Systems  Constitutional: Negative.   Respiratory: Negative.    Cardiovascular: Negative.   Gastrointestinal: Negative.   Neurological: Negative.   Psychiatric/Behavioral: Negative.       Today's Vitals   02/18/22 1555  BP: 120/62  Pulse: 82  Temp: 97.9  F (36.6 C)  TempSrc: Oral  Weight: 257 lb (116.6 kg)  Height: '5\' 5"'$  (1.651 m)  PainSc: 0-No pain   Body mass index is 42.77 kg/m.  Wt Readings from Last 3 Encounters:  02/18/22 257 lb (116.6 kg)  12/16/21 247 lb (112 kg)  06/17/21 224 lb (101.6 kg)    Objective:  Physical Exam Vitals reviewed.  Constitutional:      General: She is not in acute distress.    Appearance: Normal appearance. She is obese.  Cardiovascular:     Rate and Rhythm: Normal rate and regular rhythm.     Pulses: Normal pulses.     Heart sounds: Normal heart sounds. No murmur heard. Pulmonary:     Effort: Pulmonary effort is normal. No respiratory distress.     Breath sounds: Normal breath  sounds. No wheezing.  Skin:    General: Skin is warm and dry.     Capillary Refill: Capillary refill takes less than 2 seconds.  Neurological:     General: No focal deficit present.     Mental Status: She is alert and oriented to person, place, and time.     Cranial Nerves: No cranial nerve deficit.     Motor: No weakness.         Assessment And Plan:     1. Prediabetes Comments: Stable, continue medications tolerating well   2. Rectal itching Comments: Will treat for yeast with nystatin cream, no obvious rash noted - nystatin cream (MYCOSTATIN); Apply 1 Application topically 2 (two) times daily.  Dispense: 30 g; Refill: 0  3. Achilles tendon pain Comments: Will check a foot xray, tenderness on palpation left ankle area - DG Ankle Complete Left; Future  4. Need for influenza vaccination Influenza vaccine administered Encouraged to take Tylenol as needed for fever or muscle aches. - Flu Vaccine QUAD 6+ mos PF IM (Fluarix Quad PF)  5. Need for Tdap vaccination Will give tetanus vaccine today while in office. Refer to order management. TDAP will be administered to adults 70-38 years old every 10 years. - Tdap vaccine greater than or equal to 7yo IM  6. Morbid obesity ([place]) - Phentermine-Topiramate (QSYMIA) 3.75-23 MG CP24; Take 1 tablet by mouth daily.  Dispense: 14 capsule; Refill: 0 - Phentermine-Topiramate (QSYMIA) 7.5-46 MG CP24; Take 1 tablet by mouth daily.  Dispense: 30 capsule; Refill: 1  7. Class 3 severe obesity due to excess calories with serious comorbidity and body mass index (BMI) of 40.0 to 44.9 in adult [facility]) Comments: Will start her on Qsymia, discussed side effects.  Discussed healthy diet and regular exercise options  Encouraged to exercise at least 150 minutes per week with 2 days of strength training.  Return to office in 2 months for weight check. - Phentermine-Topiramate (QSYMIA) 3.75-23 MG CP24; Take 1 tablet by mouth daily.  Dispense: 14 capsule;  Refill: 0 - Phentermine-Topiramate (QSYMIA) 7.5-46 MG CP24; Take 1 tablet by mouth daily.  Dispense: 30 capsule; Refill: 1    Patient was given opportunity to ask questions. Patient verbalized understanding of the plan and was able to repeat key elements of the plan. All questions were answered to their satisfaction.  [name], FNP   I, [name], FNP, have reviewed all documentation for this visit. The documentation on 03/02/22 for the exam, diagnosis, procedures, and orders are all accurate and complete.   IF YOU HAVE BEEN REFERRED TO A SPECIALIST, IT MAY TAKE 1-2 WEEKS TO SCHEDULE/PROCESS THE REFERRAL. IF YOU HAVE NOT  HEARD FROM US/SPECIALIST IN TWO WEEKS, PLEASE GIVE [place] A CALL AT [phone] X [phone].   THE PATIENT IS ENCOURAGED TO PRACTICE SOCIAL DISTANCING DUE TO THE COVID-19 PANDEMIC.

## 2022-02-18 NOTE — Patient Instructions (Addendum)
Obesity, Adult ?Obesity is having too much body fat. Being obese means that your weight is more than what is healthy for you.  ?BMI (body mass index) is a number that explains how much body fat you have. If you have a BMI of 30 or more, you are obese. ?Obesity can cause serious health problems, such as: ?Stroke. ?Coronary artery disease (CAD). ?Type 2 diabetes. ?Some types of cancer. ?High blood pressure (hypertension). ?High cholesterol. ?Gallbladder stones. ?Obesity can also contribute to: ?Osteoarthritis. ?Sleep apnea. ?Infertility problems. ?What are the causes? ?Eating meals each day that are high in calories, sugar, and fat. ?Drinking a lot of drinks that have sugar in them. ?Being born with genes that may make you more likely to become obese. ?Having a medical condition that causes obesity. ?Taking certain medicines. ?Sitting a lot (having a sedentary lifestyle). ?Not getting enough sleep. ?What increases the risk? ?Having a family history of obesity. ?Living in an area with limited access to: ?Parks, recreation centers, or sidewalks. ?Healthy food choices, such as grocery stores and farmers' markets. ?What are the signs or symptoms? ?The main sign is having too much body fat. ?How is this treated? ?Treatment for this condition often includes changing your lifestyle. Treatment may include: ?Changing your diet. This may include making a healthy meal plan. ?Exercise. This may include activity that causes your heart to beat faster (aerobic exercise) and strength training. Work with your doctor to design a program that works for you. ?Medicine to help you lose weight. This may be used if you are not able to lose one pound a week after 6 weeks of healthy eating and more exercise. ?Treating conditions that cause the obesity. ?Surgery. Options may include gastric banding and gastric bypass. This may be done if: ?Other treatments have not helped to improve your condition. ?You have a BMI of 40 or higher. ?You have  life-threatening health problems related to obesity. ?Follow these instructions at home: ?Eating and drinking ? ?Follow advice from your doctor about what to eat and drink. Your doctor may tell you to: ?Limit fast food, sweets, and processed snack foods. ?Choose low-fat options. For example, choose low-fat milk instead of whole milk. ?Eat five or more servings of fruits or vegetables each day. ?Eat at home more often. This gives you more control over what you eat. ?Choose healthy foods when you eat out. ?Learn to read food labels. This will help you learn how much food is in one serving. ?Keep low-fat snacks available. ?Avoid drinks that have a lot of sugar in them. These include soda, fruit juice, iced tea with sugar, and flavored milk. ?Drink enough water to keep your pee (urine) pale yellow. ?Do not go on fad diets. ?Physical activity ?Exercise often, as told by your doctor. Most adults should get up to 150 minutes of moderate-intensity exercise every week.Ask your doctor: ?What types of exercise are safe for you. ?How often you should exercise. ?Warm up and stretch before being active. ?Do slow stretching after being active (cool down). ?Rest between times of being active. ?Lifestyle ?Work with your doctor and a food expert (dietitian) to set a weight-loss goal that is best for you. ?Limit your screen time. ?Find ways to reward yourself that do not involve food. ?Do not drink alcohol if: ?Your doctor tells you not to drink. ?You are pregnant, may be pregnant, or are planning to become pregnant. ?If you drink alcohol: ?Limit how much you have to: ?0-1 drink a day for women. ?0-2 drinks   a day for men. Know how much alcohol is in your drink. In the [place] one drink equals one 12 oz bottle of beer (355 mL), one 5 oz glass of wine (148 mL), or one 1 oz glass of hard liquor (44 mL). General instructions Keep a weight-loss journal. This can help you keep track of: The food that you eat. How much exercise you  get. Take over-the-counter and prescription medicines only as told by your doctor. Take vitamins and supplements only as told by your doctor. Think about joining a support group. Pay attention to your mental health as obesity can lead to depression or self esteem issues. Keep all follow-up visits. Contact a doctor if: You cannot meet your weight-loss goal after you have changed your diet and lifestyle for 6 weeks. You are having trouble breathing. Summary Obesity is having too much body fat. Being obese means that your weight is more than what is healthy for you. Work with your doctor to set a weight-loss goal. Get regular exercise as told by your doctor. This information is not intended to replace advice given to you by your health care provider. Make sure you discuss any questions you have with your health care provider. Document Revised: 11/26/2020 Document Reviewed: 11/26/2020 Elsevier Patient Education  [place].  Tdap (Tetanus, Diphtheria, Pertussis) Vaccine: What You Need to Know 1. Why get vaccinated? Tdap vaccine can prevent tetanus, diphtheria, and pertussis. Diphtheria and pertussis spread from person to person. Tetanus enters the body through cuts or wounds. TETANUS (T) causes painful stiffening of the muscles. Tetanus can lead to serious health problems, including being unable to open the mouth, having trouble swallowing and breathing, or death. DIPHTHERIA (D) can lead to difficulty breathing, heart failure, paralysis, or death. PERTUSSIS (aP), also known as "whooping cough," can cause uncontrollable, violent coughing that makes it hard to breathe, eat, or drink. Pertussis can be extremely serious especially in babies and young children, causing pneumonia, convulsions, brain damage, or death. In teens and adults, it can cause weight loss, loss of bladder control, passing out, and rib fractures from severe coughing. 2. Tdap vaccine Tdap is only for children 7 years and  older, adolescents, and adults.  Adolescents should receive a single dose of Tdap, preferably at age 26 or 56 years. Pregnant people should get a dose of Tdap during every pregnancy, preferably during the early part of the third trimester, to help protect the newborn from pertussis. Infants are most at risk for severe, life-threatening complications from pertussis. Adults who have never received Tdap should get a dose of Tdap. Also, adults should receive a booster dose of either Tdap or Td (a different vaccine that protects against tetanus and diphtheria but not pertussis) every 10 years, or after 5 years in the case of a severe or dirty wound or burn. Tdap may be given at the same time as other vaccines. 3. Talk with your health care provider Tell your vaccine provider if the person getting the vaccine: Has had an allergic reaction after a previous dose of any vaccine that protects against tetanus, diphtheria, or pertussis, or has any severe, life-threatening allergies Has had a coma, decreased level of consciousness, or prolonged seizures within 7 days after a previous dose of any pertussis vaccine (DTP, DTaP, or Tdap) Has seizures or another nervous system problem Has ever had Guillain-Barr Syndrome (also called "GBS") Has had severe pain or swelling after a previous dose of any vaccine that protects against tetanus or diphtheria In  some cases, your health care provider may decide to postpone Tdap vaccination until a future visit. People with minor illnesses, such as a cold, may be vaccinated. People who are moderately or severely ill should usually wait until they recover before getting Tdap vaccine.  Your health care provider can give you more information. 4. Risks of a vaccine reaction Pain, redness, or swelling where the shot was given, mild fever, headache, feeling tired, and nausea, vomiting, diarrhea, or stomachache sometimes happen after Tdap vaccination. People sometimes faint after  medical procedures, including vaccination. Tell your provider if you feel dizzy or have vision changes or ringing in the ears.  As with any medicine, there is a very remote chance of a vaccine causing a severe allergic reaction, other serious injury, or death. 5. What if there is a serious problem? An allergic reaction could occur after the vaccinated person leaves the clinic. If you see signs of a severe allergic reaction (hives, swelling of the face and throat, difficulty breathing, a fast heartbeat, dizziness, or weakness), call 9-1-1 and get the person to the nearest hospital. For other signs that concern you, call your health care provider.  Adverse reactions should be reported to the Vaccine Adverse Event Reporting System (VAERS). Your health care provider will usually file this report, or you can do it yourself. Visit the VAERS website at www.vaers.SamedayNews.es or call 213-849-3260. VAERS is only for reporting reactions, and VAERS staff members do not give medical advice. 6. The National Vaccine Injury Compensation Program The [employer] Vaccine Injury Compensation Program (VICP) is a federal program that was created to compensate people who may have been injured by certain vaccines. Claims regarding alleged injury or death due to vaccination have a time limit for filing, which may be as short as two years. Visit the VICP website at GoldCloset.com.ee or call (865)496-9069 to learn about the program and about filing a claim. 7. How can I learn more? Ask your health care provider. Call your local or state health department. Visit the website of the Food and Drug Administration (FDA) for vaccine package inserts and additional information at TraderRating.uy. Contact the Centers for Disease Control and Prevention (CDC): Call (801)516-3171 (1-800-CDC-INFO) or Visit CDC's website at http://hunter.com/. Source: CDC Vaccine Information Statement Tdap (Tetanus,  Diphtheria, Pertussis) Vaccine (12/08/2019) This same material is available at http://www.wolf.info/ for no charge. This information is not intended to replace advice given to you by your health care provider. Make sure you discuss any questions you have with your health care provider. Document Revised: 03/19/2021 Document Reviewed: 01/20/2021 Elsevier Patient Education  [place]. Influenza (Flu) Vaccine (Inactivated or Recombinant): What You Need to Know 1. Why get vaccinated? Influenza vaccine can prevent influenza (flu). Flu is a contagious disease that spreads around the [place] every year, usually between October and May. Anyone can get the flu, but it is more dangerous for some people. Infants and young children, people 50 years and older, pregnant people, and people with certain health conditions or a weakened immune system are at greatest risk of flu complications. Pneumonia, bronchitis, sinus infections, and ear infections are examples of flu-related complications. If you have a medical condition, such as heart disease, cancer, or diabetes, flu can make it worse. Flu can cause fever and chills, sore throat, muscle aches, fatigue, cough, headache, and runny or stuffy nose. Some people may have vomiting and diarrhea, though this is more common in children than adults. In an average year, thousands of people in the [place]  States die from flu, and many more are hospitalized. Flu vaccine prevents millions of illnesses and flu-related visits to the doctor each year. 2. Influenza vaccines CDC recommends everyone 6 months and older get vaccinated every flu season. Children 6 months through 32 years of age may need 2 doses during a single flu season. Everyone else needs only 1 dose each flu season. It takes about 2 weeks for protection to develop after vaccination. There are many flu viruses, and they are always changing. Each year a new flu vaccine is made to protect against the influenza  viruses believed to be likely to cause disease in the upcoming flu season. Even when the vaccine doesn't exactly match these viruses, it may still provide some protection. Influenza vaccine does not cause flu. Influenza vaccine may be given at the same time as other vaccines. 3. Talk with your health care provider Tell your vaccination provider if the person getting the vaccine: Has had an allergic reaction after a previous dose of influenza vaccine, or has any severe, life-threatening allergies Has ever had Guillain-Barr Syndrome (also called "GBS") In some cases, your health care provider may decide to postpone influenza vaccination until a future visit. Influenza vaccine can be administered at any time during pregnancy. People who are or will be pregnant during influenza season should receive inactivated influenza vaccine. People with minor illnesses, such as a cold, may be vaccinated. People who are moderately or severely ill should usually wait until they recover before getting influenza vaccine. Your health care provider can give you more information. 4. Risks of a vaccine reaction Soreness, redness, and swelling where the shot is given, fever, muscle aches, and headache can happen after influenza vaccination. There may be a very small increased risk of Guillain-Barr Syndrome (GBS) after inactivated influenza vaccine (the flu shot). Young children who get the flu shot along with pneumococcal vaccine (PCV13) and/or DTaP vaccine at the same time might be slightly more likely to have a seizure caused by fever. Tell your health care provider if a child who is getting flu vaccine has ever had a seizure. People sometimes faint after medical procedures, including vaccination. Tell your provider if you feel dizzy or have vision changes or ringing in the ears. As with any medicine, there is a very remote chance of a vaccine causing a severe allergic reaction, other serious injury, or death. 5. What  if there is a serious problem? An allergic reaction could occur after the vaccinated person leaves the clinic. If you see signs of a severe allergic reaction (hives, swelling of the face and throat, difficulty breathing, a fast heartbeat, dizziness, or weakness), call 9-1-1 and get the person to the nearest hospital. For other signs that concern you, call your health care provider. Adverse reactions should be reported to the Vaccine Adverse Event Reporting System (VAERS). Your health care provider will usually file this report, or you can do it yourself. Visit the VAERS website at www.vaers.SamedayNews.es or call (530)021-6476. VAERS is only for reporting reactions, and VAERS staff members do not give medical advice. 6. The National Vaccine Injury Compensation Program The [employer] Vaccine Injury Compensation Program (VICP) is a federal program that was created to compensate people who may have been injured by certain vaccines. Claims regarding alleged injury or death due to vaccination have a time limit for filing, which may be as short as two years. Visit the VICP website at GoldCloset.com.ee or call 601-175-1197 to learn about the program and about filing a claim. 7.  How can I learn more? Ask your health care provider. Call your local or state health department. Visit the website of the Food and Drug Administration (FDA) for vaccine package inserts and additional information at TraderRating.uy. Contact the Centers for Disease Control and Prevention (CDC): Call 587 146 7021 (1-800-CDC-INFO) or Visit CDC's website at https://gibson.com/. Source: CDC Vaccine Information Statement Inactivated Influenza Vaccine (12/08/2019) This same material is available at http://www.wolf.info/ for no charge. This information is not intended to replace advice given to you by your health care provider. Make sure you discuss any questions you have with your health care provider. Document  Revised: 03/19/2021 Document Reviewed: 01/09/2021 Elsevier Patient Education  [place] [employer] you do not need an appt

## 2022-02-21 ENCOUNTER — Ambulatory Visit (HOSPITAL_BASED_OUTPATIENT_CLINIC_OR_DEPARTMENT_OTHER): Payer: Self-pay | Admitting: Radiology

## 2022-02-24 ENCOUNTER — Other Ambulatory Visit (HOSPITAL_COMMUNITY): Payer: Self-pay

## 2022-03-18 ENCOUNTER — Encounter (HOSPITAL_BASED_OUTPATIENT_CLINIC_OR_DEPARTMENT_OTHER): Payer: Self-pay

## 2022-03-18 ENCOUNTER — Emergency Department (HOSPITAL_BASED_OUTPATIENT_CLINIC_OR_DEPARTMENT_OTHER): Payer: BC Managed Care – PPO

## 2022-03-18 ENCOUNTER — Emergency Department (HOSPITAL_BASED_OUTPATIENT_CLINIC_OR_DEPARTMENT_OTHER)
Admission: EM | Admit: 2022-03-18 | Discharge: 2022-03-19 | Disposition: A | Discharge status: Home / Self Care | Payer: BC Managed Care – PPO | Attending: Emergency Medicine | Admitting: Emergency Medicine

## 2022-03-18 ENCOUNTER — Other Ambulatory Visit: Payer: Self-pay

## 2022-03-18 DIAGNOSIS — S0990XA Unspecified injury of head, initial encounter: Secondary | ICD-10-CM | POA: Insufficient documentation

## 2022-03-18 DIAGNOSIS — W01198A Fall on same level from slipping, tripping and stumbling with subsequent striking against other object, initial encounter: Secondary | ICD-10-CM | POA: Insufficient documentation

## 2022-03-18 DIAGNOSIS — Y92039 Unspecified place in apartment as the place of occurrence of the external cause: Secondary | ICD-10-CM | POA: Insufficient documentation

## 2022-03-18 DIAGNOSIS — Y999 Unspecified external cause status: Secondary | ICD-10-CM | POA: Insufficient documentation

## 2022-03-18 DIAGNOSIS — Y9389 Activity, other specified: Secondary | ICD-10-CM | POA: Insufficient documentation

## 2022-03-18 LAB — PROTIME-INR
INR: 1 (ref 0.8–1.2)
Prothrombin Time: 12.7 seconds (ref 11.4–15.2)

## 2022-03-18 LAB — CBC WITH DIFFERENTIAL/PLATELET
Abs Immature Granulocytes: 0.01 10*3/uL (ref 0.00–0.07)
Basophils Absolute: 0 10*3/uL (ref 0.0–0.1)
Basophils Relative: 1 %
Eosinophils Absolute: 0.2 10*3/uL (ref 0.0–0.5)
Eosinophils Relative: 4 %
HCT: 42.9 % (ref 36.0–46.0)
Hemoglobin: 14.2 g/dL (ref 12.0–15.0)
Immature Granulocytes: 0 %
Lymphocytes Relative: 57 %
Lymphs Abs: 2.1 10*3/uL (ref 0.7–4.0)
MCH: 29.6 pg (ref 26.0–34.0)
MCHC: 33.1 g/dL (ref 30.0–36.0)
MCV: 89.4 fL (ref 80.0–100.0)
Monocytes Absolute: 0.3 10*3/uL (ref 0.1–1.0)
Monocytes Relative: 9 %
Neutro Abs: 1.1 10*3/uL — ABNORMAL LOW (ref 1.7–7.7)
Neutrophils Relative %: 29 %
Platelets: 217 10*3/uL (ref 150–400)
RBC: 4.8 MIL/uL (ref 3.87–5.11)
RDW: 13.2 % (ref 11.5–15.5)
WBC: 3.7 10*3/uL — ABNORMAL LOW (ref 4.0–10.5)
nRBC: 0 % (ref 0.0–0.2)

## 2022-03-18 LAB — BASIC METABOLIC PANEL
Anion gap: 9 (ref 5–15)
BUN: 13 mg/dL (ref 6–20)
CO2: 26 mmol/L (ref 22–32)
Calcium: 11.1 mg/dL — ABNORMAL HIGH (ref 8.9–10.3)
Chloride: 102 mmol/L (ref 98–111)
Creatinine, Ser: 0.93 mg/dL (ref 0.44–1.00)
GFR, Estimated: >60 mL/min (ref >60)
Glucose, Bld: 93 mg/dL (ref 70–99)
Potassium: 4.3 mmol/L (ref 3.5–5.1)
Sodium: 137 mmol/L (ref 135–145)

## 2022-03-18 LAB — HCG, QUANTITATIVE, PREGNANCY: hCG, Beta Chain, Quant, S: 2 m[IU]/mL (ref <5)

## 2022-03-18 MED ORDER — KETOROLAC TROMETHAMINE 15 MG/ML IJ SOLN
15.0000 mg | Freq: Once | INTRAMUSCULAR | Status: AC
  Administered 2022-03-18: 15 mg via INTRAVENOUS
  Filled 2022-03-18: qty 1

## 2022-03-18 MED ORDER — IOHEXOL 350 MG/ML SOLN
100.0000 mL | Freq: Once | INTRAVENOUS | Status: AC | PRN
  Administered 2022-03-18: 75 mL via INTRAVENOUS

## 2022-03-18 MED ORDER — ACETAMINOPHEN 500 MG PO TABS
1000.0000 mg | ORAL_TABLET | Freq: Once | ORAL | Status: AC
  Administered 2022-03-18: 1000 mg via ORAL
  Filled 2022-03-18: qty 2

## 2022-03-18 NOTE — ED Provider Notes (Signed)
[facility] DEPT Provider Note   CSN: [id number] Arrival date & time: 03/18/22  1621     History  Chief Complaint  Patient presents with   Fall   Head Injury    Kristi Frank is a 46 y.o. female with h/o PE on xarelto presents with head injury, fall.   Fell on Friday and hit the back of her head. Slipped getting ready for work with her socks on the hard floor. Hit on her head on the hardwood floor on the apartment. Denies LOC. Glasses flew off. Had 10/10 head pain afterward.   Started noticing yesterday evening that her left arm and leg started feeling "heavy." Then was typing something on the computer and felt very dizzy. She doesn't currently feel dizzy or lightheaded but the "heaviness" in her arm and leg has persisted. She states it feels funny when she walks or does something with her left arm. She has no h/o similar. She is able to do everything she would normally do and hasn't fallen again.   Denies h/o strokes. She is supposed to be taking xarelto for h/o PE but hasn't taken it in 3 weeks. She denies f/c, recent illnesses, facial droop, confusion, numbness/tingling, neck pain or stiffness, CP, SOB, DOE, palpitations, abd pain, N/V/D/C, urinary symptoms, extremity pain.     Fall  Head Injury      Home Medications Prior to Admission medications   Medication Sig Start Date End Date Taking? Authorizing Provider  clobetasol ointment (TEMOVATE) 7.12 % Apply 1 application topically 2 (two) times daily. 11/11/20   [name], FNP  metFORMIN (GLUCOPHAGE-XR) 500 MG 24 hr tablet Take 2 tablets (1,000 mg total) by mouth 2 (two) times daily with meals 09/08/21   [name], FNP  methocarbamol (ROBAXIN) 500 MG tablet Take 1 tablet (500 mg total) by mouth 4 (four) times daily. Patient not taking: Reported on 02/18/2022 08/29/21   [name], FNP  nystatin cream (MYCOSTATIN) Apply 1 Application topically 2 (two) times daily. 02/18/22   [name],  FNP  ondansetron (ZOFRAN) 4 MG tablet Take 1 tablet (4 mg total) by mouth daily as needed for nausea or vomiting. Patient not taking: Reported on 02/18/2022 06/04/21 06/04/22  [name], FNP  Phentermine-Topiramate (QSYMIA) 3.75-23 MG CP24 Take 1 tablet by mouth daily. 02/18/22   [name], FNP  Phentermine-Topiramate (QSYMIA) 7.5-46 MG CP24 Take 1 tablet by mouth daily. 02/18/22   [name], FNP  rivaroxaban (XARELTO) 20 MG TABS tablet Take 1 tablet (20 mg total) by mouth daily with dinner Patient not taking: Reported on 02/18/2022 06/04/21   [name], FNP      Allergies    Patient has no known allergies.    Review of Systems   Review of Systems Review of systems negative for f/c.  A 10 point review of systems was performed and is negative unless otherwise reported in HPI.  Physical Exam Updated Vital Signs BP 120/89   Pulse 81   Temp 98 F (36.7 C)   Resp 18   Ht '5\' 5"'$  (1.651 m)   Wt 115.7 kg   SpO2 100%   BMI 42.43 kg/m  Physical Exam General: Normal appearing female, lying in bed.  HEENT: PERRLA, EOMI, Sclera anicteric, MMM, trachea midline, tongue protrudes midline. NCAT. No scalp lacerations, skull depressions/deformities. Mild TTP of the posterior scalp without any abnormalities noted.  Cardiology: RRR, no murmurs/rubs/gallops. BL radial and DP pulses equal bilaterally.  Resp: Normal respiratory rate and effort.  CTAB, no wheezes, rhonchi, crackles.  Abd: Soft, non-tender, non-distended. No rebound tenderness or guarding.  GU: Deferred. MSK: No peripheral edema or signs of trauma. Extremities without deformity or TTP. No cyanosis or clubbing. Skin: warm, dry. No rashes or lesions. Back: No C, T, or L spine tenderness or stepoffs.  Neuro: A&Ox4, CNs II-XII grossly intact. 5/5 strength in all four extremities. Sensation grossly intact. Intact coordination, intact gait. NIHSS 0.  Psych: Normal mood and affect.   ED Results / Procedures / Treatments   Labs (all  labs ordered are listed, but only abnormal results are displayed) Labs Reviewed  BASIC METABOLIC PANEL - Abnormal; Notable for the following components:      Result Value   Calcium 11.1 (*)    All other components within normal limits  CBC WITH DIFFERENTIAL/PLATELET - Abnormal; Notable for the following components:   WBC 3.7 (*)    Neutro Abs 1.1 (*)    All other components within normal limits  HCG, QUANTITATIVE, PREGNANCY  PROTIME-INR   Radiology CT Head Wo Contrast  Result Date: 03/18/2022 CLINICAL DATA:  Trauma, fall EXAM: CT HEAD WITHOUT CONTRAST TECHNIQUE: Contiguous axial images were obtained from the base of the skull through the vertex without intravenous contrast. RADIATION DOSE REDUCTION: This exam was performed according to the departmental dose-optimization program which includes automated exposure control, adjustment of the mA and/or kV according to patient size and/or use of iterative reconstruction technique. COMPARISON:  None Available. FINDINGS: Brain: No acute intracranial findings are seen. There are no signs of bleeding within the cranium. Ventricles are not dilated. There is no focal edema or mass effect. Vascular: Unremarkable. Skull: No fracture is seen. Sinuses/Orbits: Unremarkable. Other: There is increased amount of CSF insula suggesting partial empty sella. IMPRESSION: No acute intracranial findings are seen in noncontrast CT brain. Electronically Signed   By: Elmer Picker M.D.   On: 03/18/2022 18:19   \\\  CTA angio head: Normal CTA of the head.   Procedures Procedures    Medications Ordered in ED Medications  acetaminophen (TYLENOL) tablet 1,000 mg (1,000 mg Oral Given 03/18/22 2039)  ketorolac (TORADOL) 15 MG/ML injection 15 mg (15 mg Intravenous Given 03/18/22 2039)  iohexol (OMNIPAQUE) 350 MG/ML injection 100 mL (75 mLs Intravenous Contrast Given 03/18/22 2056)    ED Course/ Medical Decision Making/ A&P                          Medical  Decision Making Amount and/or Complexity of Data Reviewed Labs: ordered. Radiology: ordered. Decision-making details documented in ED Course.  Risk OTC drugs. Prescription drug management.    This patient presents to the ED for concern of head trauma and subjective arm heaviness, this involves an extensive number of treatment options, and is a complaint that carries with it a high risk of complications and morbidity.  I considered the following differential and admission for this acute, potentially life threatening condition.    MDM:    In this patient with head trauma, consider ICH, skull fracture though none palpable on exam. She has complained of arm and leg heaviness since then, some dizziness intermittently, top of differential is mTBI or concussion. Also consider stroke, especially since patient is supposed to be taking Xarelto, however patient had an NIH stroke scale of 0 as her objective strength in all 4 extremities is equal and full.  Also consider hyperglycemia, Electra abnormalities will obtain basic labs.  Will obtain brain imaging to  include a CT and CTA to also evaluate vasculature though this seems unlikely at this time.  Patient is overall very well-appearing and hemodynamically stable, low concern for infectious or toxic metabolic etiology of her symptoms.  She has no chest pain or abdominal pain to suggest aortic etiology.  From the fall, she has no neck pain and no C-spine tenderness to palpation, by Nexus criteria she does not need C-spine imaging.   Clinical Course as of 04/07/22 1045  Wed Mar 18, 2022  1938 CT Head Wo Contrast No acute intracranial findings are seen in noncontrast CT brain. [HN]  2153 CT Angio Head W or Wo Contrast Normal CTA of the head. [HN]    Clinical Course User Index [HN] Audley Hose, MD     Labs: I Ordered, and personally interpreted labs.  The pertinent results include: Ca 11.1, WBC 3.7 with neutrophils 1.1, Hgb 14.2,  coags wnl, beta  hcg neg, glucose 93  Imaging Studies ordered: I ordered imaging studies including CTH, CTA H&N I independently visualized and interpreted imaging. I agree with the radiologist interpretation  Additional history obtained from chart review.  Cardiac Monitoring: The patient was maintained on a cardiac monitor.  I personally viewed and interpreted the cardiac monitored which showed an underlying rhythm of: NSR  Reevaluation: After the interventions noted above, I reevaluated the patient and found that they have :improved  Social Determinants of Health: Patient lives independently  Disposition:    Patient is informed of her lab abnormalities and instructed to stay well-hydrated and follow-up with her PCP.  She feels improved after Tylenol Toradol and is very reassured that her brain imaging is negative.  She is overall very well-appearing and at her functional baseline and would like to be discharged at this time.  We discussed that her symptoms are likely due to a concussion after her fall and that she should follow-up with her primary care physician within 1 to 2 weeks.  Discharged with discharge instructions and return precautions.  All questions answered to patient's satisfaction.   Co morbidities that complicate the patient evaluation  Past Medical History:  Diagnosis Date   Anxiety    Blood clotting disorder (HCC)    Migraine    Prediabetes      Medicines Meds ordered this encounter  Medications   acetaminophen (TYLENOL) tablet 1,000 mg   ketorolac (TORADOL) 15 MG/ML injection 15 mg   iohexol (OMNIPAQUE) 350 MG/ML injection 100 mL    I have reviewed the patients home medicines and have made adjustments as needed  Problem List / ED Course: Problem List Items Addressed This Visit   None Visit Diagnoses     Traumatic injury of head, initial encounter    -  Primary         Audley Hose, MD 04/07/22 1110

## 2022-03-18 NOTE — ED Triage Notes (Addendum)
Pt state she slipped and fell on Friday and hit her head. PT denies loc and is alert and oriented x4. Pt states she does take xarelto for hx of PE, however it has been about 3 weeks since she took it. Pt states the pressure in her head has not improved. Pt reports heaviness in left arm and tingling in left leg that started yesterday.

## 2022-03-19 NOTE — ED Notes (Signed)
Reviewed AVS/discharge instruction with patient. Time allotted for and all questions answered. Patient is agreeable for d/c and escorted to ed exit by staff.  

## 2022-03-19 NOTE — Discharge Instructions (Addendum)
Thank you for coming to [facility]. You were seen for head injury. We did an exam, labs, and imaging, and these showed no acute findings.  Please follow up with your primary care provider within 1 week.  You can alternate taking Tylenol and ibuprofen as needed for pain. You can take '650mg'$  tylenol (acetaminophen) every 4-6 hours, and 600 mg ibuprofen 3 times a day. Please take your medications as prescribed.  Do not hesitate to return to the ED or call 911 if you experience: -Worsening symptoms -Lightheadedness, passing out -Fevers/chills -Anything else that concerns you

## 2022-03-24 ENCOUNTER — Telehealth: Payer: Self-pay

## 2022-03-24 NOTE — Telephone Encounter (Signed)
Transition Care Management Unsuccessful Follow-up Telephone Call  Date of discharge and from where:  03/18/2022 [facility]   Attempts:  1st Attempt  Reason for unsuccessful TCM follow-up call:  Left voice message

## 2022-03-24 NOTE — Telephone Encounter (Signed)
Transition Care Management Unsuccessful Follow-up Telephone Call  Date of discharge and from where:  03/18/2022   Attempts:  1st Attempt  Reason for unsuccessful TCM follow-up call:  Left voice message

## 2022-04-23 ENCOUNTER — Ambulatory Visit: Payer: BC Managed Care – PPO | Admitting: Nurse Practitioner

## 2022-04-25 ENCOUNTER — Encounter (HOSPITAL_BASED_OUTPATIENT_CLINIC_OR_DEPARTMENT_OTHER): Payer: BC Managed Care – PPO | Admitting: Radiology

## 2022-04-25 DIAGNOSIS — Z1231 Encounter for screening mammogram for malignant neoplasm of breast: Secondary | ICD-10-CM

## 2022-05-14 ENCOUNTER — Encounter: Payer: Self-pay | Admitting: Nurse Practitioner

## 2022-05-28 ENCOUNTER — Other Ambulatory Visit: Payer: Self-pay | Admitting: Nurse Practitioner

## 2022-05-28 MED ORDER — QSYMIA 7.5-46 MG PO CP24: 1.0000 | ORAL_CAPSULE | Freq: Every day | ORAL | 1 refills | Status: DC

## 2022-06-23 ENCOUNTER — Ambulatory Visit (INDEPENDENT_AMBULATORY_CARE_PROVIDER_SITE_OTHER): Payer: BC Managed Care – PPO | Admitting: Nurse Practitioner

## 2022-06-23 ENCOUNTER — Encounter: Payer: Self-pay | Admitting: Nurse Practitioner

## 2022-06-23 VITALS — BP 124/88 | HR 84 | Temp 97.8°F | Ht 65.0 in | Wt 283.0 lb

## 2022-06-23 DIAGNOSIS — R7303 Prediabetes: Secondary | ICD-10-CM

## 2022-06-23 DIAGNOSIS — Z0000 Encounter for general adult medical examination without abnormal findings: Secondary | ICD-10-CM

## 2022-06-23 DIAGNOSIS — Z6841 Body Mass Index (BMI) 40.0 and over, adult: Secondary | ICD-10-CM

## 2022-06-23 DIAGNOSIS — Z1211 Encounter for screening for malignant neoplasm of colon: Secondary | ICD-10-CM | POA: Diagnosis not present

## 2022-06-23 DIAGNOSIS — Z1321 Encounter for screening for nutritional disorder: Secondary | ICD-10-CM | POA: Diagnosis not present

## 2022-06-23 DIAGNOSIS — Z87820 Personal history of traumatic brain injury: Secondary | ICD-10-CM

## 2022-06-23 DIAGNOSIS — Z86711 Personal history of pulmonary embolism: Secondary | ICD-10-CM

## 2022-06-23 DIAGNOSIS — Z13220 Encounter for screening for lipoid disorders: Secondary | ICD-10-CM | POA: Diagnosis not present

## 2022-06-23 DIAGNOSIS — Z136 Encounter for screening for cardiovascular disorders: Secondary | ICD-10-CM

## 2022-06-23 MED ORDER — ZEPBOUND 2.5 MG/0.5ML ~~LOC~~ SOAJ: 2.5000 mg | SUBCUTANEOUS | 0 refills | Status: DC

## 2022-06-23 MED ORDER — RIVAROXABAN 20 MG PO TABS: 20.0000 mg | ORAL_TABLET | Freq: Every day | ORAL | 0 refills | Status: DC

## 2022-06-23 NOTE — Progress Notes (Signed)
I,Kristi Frank,acting as a Education administrator for Kristi Brine, FNP.,have documented all relevant documentation on the behalf of Kristi Brine, FNP,as directed by  Kristi Brine, FNP while in the presence of Kristi Frank, [place].   Subjective:     Patient ID: Kristi Frank , female    DOB: 23-Apr-1976 , 47 y.o.   MRN: [id number]   Chief Complaint  Patient presents with   Annual Exam    HPI  Patient presents today for annual exam.   Patient does report new onset of knee pain, right. She has been taking the stairs more at work, also had recent fall onto her knee while skating. She had an injury and hit her head outside while looking at an "insect". She had a head injury and diagnosed with a concussion. Patient has no other complaints or concerns.   Wt Readings from Last 3 Encounters: 06/23/22 : 286 lb (129.7 kg) 03/18/22 : 255 lb (115.7 kg) 02/18/22 : 257 lb (116.6 kg)  She was taking Qsymia but had cotton mouth and feeling dizzy. She feels like she seen 2 lb difference. She was home for 4 days in [place] for the holidays. She feels like she is heavier in the stomach.   [facility] in 2021.         Past Medical History:  Diagnosis Date   Anxiety    Blood clotting disorder ([facility])    Migraine    Prediabetes      Family History  Problem Relation Age of Onset   Epilepsy Mother    Hypertension Mother    Healthy Father    Diabetes Maternal Aunt    Cancer Paternal Uncle    Healthy Maternal Grandmother    Dementia Paternal Grandmother    Heart disease Paternal Grandfather    Clotting disorder Other      Current Outpatient Medications:    clobetasol ointment (TEMOVATE) [id number] %, Apply 1 application topically 2 (two) times daily., Disp: 30 g, Rfl: 2   nystatin cream (MYCOSTATIN), Apply 1 Application topically 2 (two) times daily., Disp: 30 g, Rfl: 0   tirzepatide (ZEPBOUND) 2.5 MG/0.5ML Pen, Inject 2.5 mg into the skin once a week., Disp: 2 mL, Rfl: 0    metFORMIN (GLUCOPHAGE-XR) 500 MG 24 hr tablet, Take 2 tablets (1,000 mg total) by mouth 2 (two) times daily with meals (Patient not taking: Reported on 06/23/2022), Disp: 360 tablet, Rfl: 0   rivaroxaban (XARELTO) 20 MG TABS tablet, Take 1 tablet (20 mg total) by mouth daily with dinner, Disp: 30 tablet, Rfl: 5   No Known Allergies    The patient states she is status post hysterectomy.  No LMP recorded. Patient has had a hysterectomy.. Negative for Dysmenorrhea and Negative for Menorrhagia. Negative for: breast discharge, breast lump(s), breast pain and breast self exam. Associated symptoms include abnormal vaginal bleeding. Pertinent negatives include abnormal bleeding (hematology), anxiety, decreased libido, depression, difficulty falling sleep, dyspareunia, history of infertility, nocturia, sexual dysfunction, sleep disturbances, urinary incontinence, urinary urgency, vaginal discharge and vaginal itching. Diet regular.- - varies - protein shake and nothing the rest of the day and sometimes will not eat breakfast or lunch but will eat dinner. [place] and broccoli. On the weekends may eat chicfila. She will eat some sweet potatoes. She is eating late due to staying at work late. Reports she drinks "a lot of water". She is drinking a "stanley cup size water x 2 per day at work". The patient states her  exercise level is minimal - 2 days a week at work. Intermittent incline on treadmill. No strength training.   The patient's tobacco use is:  Social History   Tobacco Use  Smoking Status Never  Smokeless Tobacco Never  She has been exposed to passive smoke. The patient's alcohol use is:  Social History   Substance and Sexual Activity  Alcohol Use Yes   Comment: socially      Review of Systems  Constitutional: Negative.   HENT: Negative.    Eyes: Negative.   Respiratory: Negative.    Cardiovascular: Negative.  Negative for chest pain, palpitations and leg swelling.  Gastrointestinal: Negative.    Endocrine: Negative.   Genitourinary: Negative.   Musculoskeletal: Negative.        Right flank pain with movement  Skin: Negative.   Allergic/Immunologic: Negative.   Neurological: Negative.   Hematological: Negative.   Psychiatric/Behavioral: Negative.       Today's Vitals   06/23/22 1543  BP: 124/88  Pulse: 84  Temp: 97.8 F (36.6 C)  TempSrc: Oral  SpO2: 99%  Weight: 283 lb (128.4 kg)  Height: '5\' 5"'$  (1.651 m)   Body mass index is 47.09 kg/m.   Objective:  Physical Exam Vitals reviewed.  Constitutional:      General: She is not in acute distress.    Appearance: Normal appearance. She is well-developed. She is obese.  HENT:     Head: Normocephalic and atraumatic.     Right Ear: Hearing, tympanic membrane, ear canal and external ear normal. There is no impacted cerumen.     Left Ear: Hearing, tympanic membrane, ear canal and external ear normal. There is no impacted cerumen.     Nose:     Comments: Deferred - masked    Mouth/Throat:     Dentition: Has dentures (upper and lower).     Comments: Deferred - masked Eyes:     General: Lids are normal.     Extraocular Movements: Extraocular movements intact.     Conjunctiva/sclera: Conjunctivae normal.     Pupils: Pupils are equal, round, and reactive to light.     Funduscopic exam:    Right eye: No papilledema.        Left eye: No papilledema.  Neck:     Thyroid: No thyroid mass.     Vascular: No carotid bruit.  Cardiovascular:     Rate and Rhythm: Normal rate and regular rhythm.     Pulses: Normal pulses.     Heart sounds: Normal heart sounds. No murmur heard. Pulmonary:     Effort: Pulmonary effort is normal. No respiratory distress.     Breath sounds: Normal breath sounds. No wheezing.  Chest:     Chest wall: No mass.  Breasts:    Tanner Score is 5.     Right: Normal. No mass or tenderness.     Left: Normal. No mass or tenderness.  Abdominal:     General: Abdomen is flat. Bowel sounds are normal.  There is no distension.     Palpations: Abdomen is soft.     Tenderness: There is no abdominal tenderness.  Musculoskeletal:        General: Tenderness (right flank area) present. No swelling. Normal range of motion.     Cervical back: Full passive range of motion without pain, normal range of motion and neck supple.     Right lower leg: No edema.     Left lower leg: No edema.  Lymphadenopathy:  Upper Body:     Right upper body: No supraclavicular, axillary or pectoral adenopathy.     Left upper body: No supraclavicular, axillary or pectoral adenopathy.  Skin:    General: Skin is warm and dry.     Capillary Refill: Capillary refill takes less than 2 seconds.  Neurological:     General: No focal deficit present.     Mental Status: She is alert and oriented to person, place, and time.     Cranial Nerves: No cranial nerve deficit.     Sensory: No sensory deficit.     Motor: No weakness.  Psychiatric:        Mood and Affect: Mood normal.        Behavior: Behavior normal.        Thought Content: Thought content normal.        Judgment: Judgment normal.         Assessment And Plan:     1. Encounter for annual physical exam Behavior modifications discussed and diet history reviewed.   Pt will continue to exercise regularly and modify diet with low GI, plant based foods and decrease intake of processed foods.  Recommend intake of daily multivitamin, Vitamin D, and calcium.  Recommend mammogram and colonoscopy for preventive screenings, as well as recommend immunizations that include influenza, TDAP, and Shingles - CBC - CMP14+EGFR  2. Encounter for lipid screening for cardiovascular disease - Lipid panel  3. Encounter for vitamin deficiency screening Will check vitamin D level and supplement as needed.    Also encouraged to spend 15 minutes in the sun daily.  - VITAMIN D 25 Hydroxy (Vit-D Deficiency, Fractures)  4. Encounter for screening colonoscopy According to USPTF  Colorectal cancer Screening guidelines. Colonoscopy is recommended every 10 years, starting at age 83 years. Will refer to GI for colon cancer screening. - Ambulatory referral to Gastroenterology  5. BMI 45.0-49.9, adult (HCC) Chronic Discussed healthy diet and regular exercise options  Encouraged to exercise at least 150 minutes per week with 2 days of strength training Given handout for [email or web address]://[email or web address] 10 tips, CDC exercise for adults and CDC Eat More Weight Less Will start Zepbound pending insurance approval she is to titrate weekly, discussed side effects of nausea, abdominal pain or difficulty swallowing to notify office. Will do teaching once she picks up prescription  Return in 2 months for weight check. - tirzepatide (ZEPBOUND) 2.5 MG/0.5ML Pen; Inject 2.5 mg into the skin once a week.  Dispense: 2 mL; Refill: 0  6. Prediabetes Comments: HgbA1c is stable continue with low carb and sugar diet - Hemoglobin A1c  7. History of pulmonary embolism Comments: Had total of 4 PE's while living in [place], referral placed to Hematology in October 2022 she will call for appt to [facility]. - Ambulatory referral to Hematology / Oncology  8. History of concussion Comments: No current issues   Patient was given opportunity to ask questions. Patient verbalized understanding of the plan and was able to repeat key elements of the plan. All questions were answered to their satisfaction.   Kristi Brine, FNP   I, Kristi Brine, FNP, have reviewed all documentation for this visit. The documentation on 06/23/22 for the exam, diagnosis, procedures, and orders are all accurate and complete.   THE PATIENT IS ENCOURAGED TO PRACTICE SOCIAL DISTANCING DUE TO THE COVID-19 PANDEMIC.

## 2022-06-24 ENCOUNTER — Other Ambulatory Visit: Payer: Self-pay

## 2022-06-24 ENCOUNTER — Telehealth: Payer: Self-pay

## 2022-06-24 DIAGNOSIS — Z86711 Personal history of pulmonary embolism: Secondary | ICD-10-CM

## 2022-06-24 LAB — CMP14+EGFR
ALT: 30 IU/L (ref 0–32)
AST: 25 IU/L (ref 0–40)
Albumin/Globulin Ratio: 1.6 (ref 1.2–2.2)
Albumin: 4.6 g/dL (ref 3.9–4.9)
Alkaline Phosphatase: 117 IU/L (ref 44–121)
BUN/Creatinine Ratio: 14 (ref 9–23)
BUN: 11 mg/dL (ref 6–24)
Bilirubin Total: 0.3 mg/dL (ref 0.0–1.2)
CO2: 23 mmol/L (ref 20–29)
Calcium: 11.4 mg/dL — ABNORMAL HIGH (ref 8.7–10.2)
Chloride: 99 mmol/L (ref 96–106)
Creatinine, Ser: 0.81 mg/dL (ref 0.57–1.00)
Globulin, Total: 2.9 g/dL (ref 1.5–4.5)
Glucose: 96 mg/dL (ref 70–99)
Potassium: 4.9 mmol/L (ref 3.5–5.2)
Sodium: 136 mmol/L (ref 134–144)
Total Protein: 7.5 g/dL (ref 6.0–8.5)
eGFR: 91 mL/min/{1.73_m2} (ref >59)

## 2022-06-24 LAB — HEMOGLOBIN A1C
Est. average glucose Bld gHb Est-mCnc: 126 mg/dL
Hgb A1c MFr Bld: 6 % — ABNORMAL HIGH (ref 4.8–5.6)

## 2022-06-24 LAB — CBC
Hematocrit: 42.8 % (ref 34.0–46.6)
Hemoglobin: 14.1 g/dL (ref 11.1–15.9)
MCH: 29 pg (ref 26.6–33.0)
MCHC: 32.9 g/dL (ref 31.5–35.7)
MCV: 88 fL (ref 79–97)
Platelets: 257 10*3/uL (ref 150–450)
RBC: 4.86 x10E6/uL (ref 3.77–5.28)
RDW: 12.9 % (ref 11.7–15.4)
WBC: 4.9 10*3/uL (ref 3.4–10.8)

## 2022-06-24 LAB — VITAMIN D 25 HYDROXY (VIT D DEFICIENCY, FRACTURES): Vit D, 25-Hydroxy: 11.4 ng/mL — ABNORMAL LOW (ref 30.0–100.0)

## 2022-06-24 LAB — LIPID PANEL
Chol/HDL Ratio: 2.4 ratio (ref 0.0–4.4)
Cholesterol, Total: 225 mg/dL — ABNORMAL HIGH (ref 100–199)
HDL: 92 mg/dL (ref >39)
LDL Chol Calc (NIH): 122 mg/dL — ABNORMAL HIGH (ref 0–99)
Triglycerides: 63 mg/dL (ref 0–149)
VLDL Cholesterol Cal: 11 mg/dL (ref 5–40)

## 2022-06-24 MED ORDER — RIVAROXABAN 20 MG PO TABS: 20.0000 mg | ORAL_TABLET | Freq: Every day | ORAL | 5 refills | Status: DC

## 2022-06-24 NOTE — Telephone Encounter (Signed)
Per pharmacy Zepbound requires PA.  PA started via CMM Key: [id number]  Outcome Approved today [id number];Review Type:Prior Auth;Coverage Start Date:05/25/2022;Coverage End Date:02/19/2023

## 2022-06-25 ENCOUNTER — Telehealth: Payer: Self-pay

## 2022-06-25 NOTE — Telephone Encounter (Signed)
Per pharmacy PA required for Xarelto  Pa started via [place]: [id number]  [id number];Review Type:Prior Auth;Coverage Start Date:05/26/2022;Coverage End Date:06/25/2023;. Authorization Expiration Date: June 25, 2023

## 2022-07-01 DIAGNOSIS — Z86711 Personal history of pulmonary embolism: Secondary | ICD-10-CM | POA: Insufficient documentation

## 2022-07-02 ENCOUNTER — Encounter: Payer: Self-pay | Admitting: Nurse Practitioner

## 2022-07-06 ENCOUNTER — Other Ambulatory Visit: Payer: Self-pay | Admitting: Nurse Practitioner

## 2022-07-06 ENCOUNTER — Other Ambulatory Visit: Payer: Self-pay

## 2022-07-06 DIAGNOSIS — R7989 Other specified abnormal findings of blood chemistry: Secondary | ICD-10-CM

## 2022-07-17 ENCOUNTER — Telehealth: Payer: Self-pay | Admitting: Oncology

## 2022-07-17 NOTE — Telephone Encounter (Signed)
Attempted to contact patient in regards to scheduling appointment per referral, no answer so voicemail was left     

## 2022-07-18 ENCOUNTER — Other Ambulatory Visit (HOSPITAL_BASED_OUTPATIENT_CLINIC_OR_DEPARTMENT_OTHER): Payer: Self-pay

## 2022-07-18 ENCOUNTER — Other Ambulatory Visit: Payer: Self-pay | Admitting: Nurse Practitioner

## 2022-07-18 ENCOUNTER — Ambulatory Visit (HOSPITAL_BASED_OUTPATIENT_CLINIC_OR_DEPARTMENT_OTHER)
Admission: RE | Admit: 2022-07-18 | Discharge: 2022-07-18 | Disposition: A | Discharge status: Home / Self Care | Payer: BC Managed Care – PPO | Source: Ambulatory Visit | Attending: Nurse Practitioner | Admitting: Nurse Practitioner

## 2022-07-18 DIAGNOSIS — Z1231 Encounter for screening mammogram for malignant neoplasm of breast: Secondary | ICD-10-CM | POA: Insufficient documentation

## 2022-07-18 DIAGNOSIS — Z6841 Body Mass Index (BMI) 40.0 and over, adult: Secondary | ICD-10-CM

## 2022-07-20 ENCOUNTER — Encounter: Payer: Self-pay | Admitting: Nurse Practitioner

## 2022-07-20 MED ORDER — ZEPBOUND 2.5 MG/0.5ML ~~LOC~~ SOAJ: 2.5000 mg | SUBCUTANEOUS | 0 refills | Status: DC

## 2022-07-21 ENCOUNTER — Other Ambulatory Visit: Payer: Self-pay | Admitting: Nurse Practitioner

## 2022-07-21 ENCOUNTER — Other Ambulatory Visit (HOSPITAL_COMMUNITY): Payer: Self-pay

## 2022-07-21 DIAGNOSIS — Z6841 Body Mass Index (BMI) 40.0 and over, adult: Secondary | ICD-10-CM

## 2022-07-21 MED ORDER — ZEPBOUND 5 MG/0.5ML ~~LOC~~ SOAJ
5.0000 mg | SUBCUTANEOUS | 1 refills | Status: DC
  Filled 2022-07-21: qty 2, 28d supply, fill #0

## 2022-07-30 ENCOUNTER — Telehealth: Payer: Self-pay | Admitting: Oncology

## 2022-07-30 NOTE — Telephone Encounter (Signed)
Attempted to contact patient in regards to scheduling appointment per referral, no answer so voicemail was left

## 2022-08-17 ENCOUNTER — Other Ambulatory Visit (HOSPITAL_COMMUNITY): Payer: Self-pay

## 2022-08-17 ENCOUNTER — Other Ambulatory Visit: Payer: Self-pay | Admitting: Nurse Practitioner

## 2022-08-17 DIAGNOSIS — Z6841 Body Mass Index (BMI) 40.0 and over, adult: Secondary | ICD-10-CM

## 2022-08-17 MED ORDER — ZEPBOUND 7.5 MG/0.5ML ~~LOC~~ SOAJ
7.5000 mg | SUBCUTANEOUS | 2 refills | Status: DC
  Filled 2022-08-17 – 2022-10-19 (×2): qty 2, 28d supply, fill #0

## 2022-09-23 ENCOUNTER — Encounter: Payer: Self-pay | Admitting: Nurse Practitioner

## 2022-10-05 ENCOUNTER — Other Ambulatory Visit: Payer: Self-pay | Admitting: Nurse Practitioner

## 2022-10-05 ENCOUNTER — Telehealth: Payer: Self-pay | Admitting: *Deleted

## 2022-10-05 DIAGNOSIS — D500 Iron deficiency anemia secondary to blood loss (chronic): Secondary | ICD-10-CM

## 2022-10-05 NOTE — Telephone Encounter (Signed)
Provider called to inquire if a new referral needs to be placed since it was closed due to not being able to reach patient? Informed them that yes, a new referral order needs to be placed.

## 2022-10-12 ENCOUNTER — Telehealth: Payer: Self-pay

## 2022-10-12 NOTE — Telephone Encounter (Signed)
Patient called today asking for a chest xray due to being short of breath, patient was encouraged to go to Urgent care or hospital for SOB. Patient reported she did not want to go due to the bill. Patient does have appointment  on 6/19 but was told not to wait for her appointment and go to urgent care.

## 2022-10-19 ENCOUNTER — Other Ambulatory Visit: Payer: Self-pay

## 2022-10-19 ENCOUNTER — Other Ambulatory Visit (HOSPITAL_COMMUNITY): Payer: Self-pay

## 2022-10-21 ENCOUNTER — Other Ambulatory Visit (HOSPITAL_COMMUNITY): Payer: Self-pay

## 2022-10-21 ENCOUNTER — Ambulatory Visit: Payer: BC Managed Care – PPO | Admitting: Nurse Practitioner

## 2022-10-21 NOTE — Progress Notes (Deleted)
Madelaine Bhat, CMA,acting as a Neurosurgeon for Arnette Felts, FNP.,have documented all relevant documentation on the behalf of Arnette Felts, FNP,as directed by  Arnette Felts, FNP while in the presence of Arnette Felts, FNP.  Subjective:  Patient ID: Kristi Frank , female    DOB: 1975-05-18 , 47 y.o.   MRN: [id number]  No chief complaint on file.   HPI  Patient presents today for a pre DM and iron check, patient reports compliance with medications and has no other concerns today. Patient denies any chest pain, SOB, and headaches.     Past Medical History:  Diagnosis Date  . Anxiety   . Blood clotting disorder (HCC)   . Migraine   . Prediabetes      Family History  Problem Relation Age of Onset  . Epilepsy Mother   . Hypertension Mother   . Healthy Father   . Diabetes Maternal Aunt   . Cancer Paternal Uncle   . Healthy Maternal Grandmother   . Dementia Paternal Grandmother   . Heart disease Paternal Grandfather   . Clotting disorder Other      Current Outpatient Medications:  .  tirzepatide (ZEPBOUND) 7.5 MG/0.5ML Pen, Inject 7.5 mg into the skin once a week., Disp: 2 mL, Rfl: 2 .  clobetasol ointment (TEMOVATE) 0.05 %, Apply 1 application topically 2 (two) times daily., Disp: 30 g, Rfl: 2 .  metFORMIN (GLUCOPHAGE-XR) 500 MG 24 hr tablet, Take 2 tablets (1,000 mg total) by mouth 2 (two) times daily with meals (Patient not taking: Reported on 06/23/2022), Disp: 360 tablet, Rfl: 0 .  nystatin cream (MYCOSTATIN), Apply 1 Application topically 2 (two) times daily., Disp: 30 g, Rfl: 0 .  rivaroxaban (XARELTO) 20 MG TABS tablet, Take 1 tablet (20 mg total) by mouth daily with dinner, Disp: 30 tablet, Rfl: 5   No Known Allergies   Review of Systems   There were no vitals filed for this visit. There is no height or weight on file to calculate BMI.  Wt Readings from Last 3 Encounters:  06/23/22 283 lb (128.4 kg)  03/18/22 255 lb (115.7 kg)  02/18/22 257 lb (116.6 kg)    The  10-year ASCVD risk score (Arnett DK, et al., 2019) is: 0.4%   Values used to calculate the score:     Age: 62 years     Sex: Female     Is Non-Hispanic African American: Yes     Diabetic: No     Tobacco smoker: No     Systolic Blood Pressure: 124 mmHg     Is BP treated: No     HDL Cholesterol: 92 mg/dL     Total Cholesterol: 225 mg/dL  Objective:  Physical Exam      Assessment And Plan:  1. Prediabetes  2. Iron deficiency anemia due to chronic blood loss  3. Elevated parathyroid hormone  4. History of pulmonary embolism    No follow-ups on file.  Patient was given opportunity to ask questions. Patient verbalized understanding of the plan and was able to repeat key elements of the plan. All questions were answered to their satisfaction.  Arnette Felts, FNP  I, Arnette Felts, FNP, have reviewed all documentation for this visit. The documentation on 10/21/22 for the exam, diagnosis, procedures, and orders are all accurate and complete.   IF YOU HAVE BEEN REFERRED TO A SPECIALIST, IT MAY TAKE 1-2 WEEKS TO SCHEDULE/PROCESS THE REFERRAL. IF YOU HAVE NOT HEARD FROM US/SPECIALIST IN TWO WEEKS, PLEASE  GIVE [place] A CALL AT [phone].

## 2022-10-22 ENCOUNTER — Ambulatory Visit: Payer: BC Managed Care – PPO | Admitting: Nurse Practitioner

## 2022-10-27 ENCOUNTER — Telehealth: Payer: Self-pay | Admitting: Hematology

## 2022-10-27 ENCOUNTER — Inpatient Hospital Stay: Payer: BC Managed Care – PPO | Attending: Oncology | Admitting: Oncology

## 2022-10-27 ENCOUNTER — Telehealth: Payer: Self-pay | Admitting: Nurse Practitioner

## 2022-10-27 ENCOUNTER — Inpatient Hospital Stay: Payer: BC Managed Care – PPO

## 2022-11-14 ENCOUNTER — Inpatient Hospital Stay: Payer: BC Managed Care – PPO

## 2022-11-14 ENCOUNTER — Inpatient Hospital Stay: Payer: BC Managed Care – PPO | Attending: Oncology | Admitting: Hematology

## 2022-11-14 NOTE — Progress Notes (Deleted)
[facility]   Telephone:(336) [phone] Fax:(336) [phone]   Clinic New Consult Note   Patient Care Team: Arnette Felts, FNP as PCP - General (General Practice) 11/14/2022  CHIEF COMPLAINTS/PURPOSE OF CONSULTATION:  ***  HISTORY OF PRESENTING ILLNESS:  Kristi Frank 47 y.o. female is here because of ***  ***She denies ***lower extremity swelling, warmth, tenderness & erythema.  She denies recent chest pain on exertion, shortness of breath on minimal exertion, pre-syncopal episodes, hemoptysis, or palpitation. ***She denies recent *** history of trauma, *** long distance travel, dehydration, recent surgery, ***smoking or prolonged immobilization. ***She had no prior history or diagnosis of cancer. Her age appropriate screening programs are up-to-date. ***She had prior surgeries before and never had perioperative thromboembolic events. ***The patient had been exposed to birth control pills *** hormone replacement therapy *** testosterone replacement therapy and never had thrombotic events. ***The patient had been pregnant before and denies history of peripartum thromboembolic event or history of recurrent miscarriages. ***There is no family history of blood clots or miscarriages.  MEDICAL HISTORY:  Past Medical History:  Diagnosis Date   Anxiety    Blood clotting disorder (HCC)    Migraine    Prediabetes     SURGICAL HISTORY: Past Surgical History:  Procedure Laterality Date   ABDOMINAL HYSTERECTOMY     BREAST REDUCTION SURGERY     CESAREAN SECTION     FOOT SURGERY      SOCIAL HISTORY: Social History   Socioeconomic History   Marital status: Single    Spouse name: Not on file   Number of children: Not on file   Years of education: Not on file   Highest education level: Not on file  Occupational History   Not on file  Tobacco Use   Smoking status: Never   Smokeless tobacco: Never  Substance and Sexual Activity   Alcohol use: Yes    Comment:  socially    Drug use: Never   Sexual activity: Not on file  Other Topics Concern   Not on file  Social History Narrative   Not on file   Social Determinants of Health   Financial Resource Strain: Not on file  Food Insecurity: Not on file  Transportation Needs: Not on file  Physical Activity: Not on file  Stress: Not on file  Social Connections: Not on file  Intimate Partner Violence: Not on file    FAMILY HISTORY: Family History  Problem Relation Age of Onset   Epilepsy Mother    Hypertension Mother    Healthy Father    Diabetes Maternal Aunt    Cancer Paternal Uncle    Healthy Maternal Grandmother    Dementia Paternal Grandmother    Heart disease Paternal Grandfather    Clotting disorder Other     ALLERGIES:  has No Known Allergies.  MEDICATIONS:  Current Outpatient Medications  Medication Sig Dispense Refill   tirzepatide (ZEPBOUND) 7.5 MG/0.5ML Pen Inject 7.5 mg into the skin once a week. 2 mL 2   clobetasol ointment (TEMOVATE) 0.05 % Apply 1 application topically 2 (two) times daily. 30 g 2   metFORMIN (GLUCOPHAGE-XR) 500 MG 24 hr tablet Take 2 tablets (1,000 mg total) by mouth 2 (two) times daily with meals (Patient not taking: Reported on 06/23/2022) 360 tablet 0   nystatin cream (MYCOSTATIN) Apply 1 Application topically 2 (two) times daily. 30 g 0   rivaroxaban (XARELTO) 20 MG TABS tablet Take 1 tablet (20 mg total) by mouth daily with dinner  30 tablet 5   No current facility-administered medications for this visit.    REVIEW OF SYSTEMS:   Constitutional: Denies fevers, chills or abnormal night sweats Eyes: Denies blurriness of vision, double vision or watery eyes Ears, nose, mouth, throat, and face: Denies mucositis or sore throat Respiratory: Denies cough, dyspnea or wheezes Cardiovascular: Denies palpitation, chest discomfort or lower extremity swelling Gastrointestinal:  Denies nausea, heartburn or change in bowel habits Skin: Denies abnormal skin  rashes Lymphatics: Denies new lymphadenopathy or easy bruising Neurological:Denies numbness, tingling or new weaknesses Behavioral/Psych: Mood is stable, no new changes  All other systems were reviewed with the patient and are negative.  PHYSICAL EXAMINATION: ECOG PERFORMANCE STATUS: {CHL ONC ECOG PS:[id number]}  There were no vitals filed for this visit. There were no vitals filed for this visit.  GENERAL:alert, no distress and comfortable SKIN: skin color, texture, turgor are normal, no rashes or significant lesions EYES: normal, conjunctiva are pink and non-injected, sclera clear OROPHARYNX:no exudate, no erythema and lips, buccal mucosa, and tongue normal  NECK: supple, thyroid normal size, non-tender, without nodularity LYMPH:  no palpable lymphadenopathy in the cervical, axillary or inguinal LUNGS: clear to auscultation and percussion with normal breathing effort HEART: regular rate & rhythm and no murmurs and no lower extremity edema ABDOMEN:abdomen soft, non-tender and normal bowel sounds Musculoskeletal:no cyanosis of digits and no clubbing  PSYCH: alert & oriented x 3 with fluent speech NEURO: no focal motor/sensory deficits  LABORATORY DATA:  I have reviewed the data as listed No results found for this or any previous visit (from the past 2160 hour(s)).  RADIOGRAPHIC STUDIES: I have personally reviewed the radiological images as listed and agreed with the findings in the report. No results found.  ASSESSMENT:  *** DVT/PE  PLAN:  I reviewed with the patient about the plan for care for recurrent ***DVT/PE.  This last episode of blood clot appeared to be ***unprovoked. We discussed about the pros and cons about testing for thrombophilia disorder. her current anticoagulation therapy will interfere with some the tests and it is not possible to interpret the test results.  Taking her off the anticoagulation therapy to do the tests may precipitate another thrombotic event.  I do not see a reason to order excessive testing to screen for thrombophilia disorder as it would not change our management.  The goal of anticoagulation therapy is for life.   We discussed about various options of anticoagulation therapies including warfarin, low molecular weight heparin such as Lovenox or newer agents such as Rivaroxaban. Some of the risks and benefits discussed including costs involved, the need for monitoring, risks of life-threatening bleeding/hospitalization, reversibility of each agent in the event of bleeding or overdose, safety profile of each drug and taking into account other social issues such as ease of administration of medications, etc. Ultimately, we have made an informed decision for the patient to continue her treatment with ***  I recommend the patient to use elastic compression stockings at 20-30 mmHg to reduce risks of chronic thrombophlebitis.  Another main issue we discussed today included the role of screening other family members for thrombophilia disorder. At present time, I would not recommend testing the patient's family members as it would not benefit them.  Thrombophilia disorder is a genetic predisposition which increases an individual's risk for a thrombotic event, NOT a disease.  We discussed the implications of genetic screening including the possibility of uninsurability, costs involved, emotional distress and possible discrimination at various levels for the affected individual.  Rather than genetic screening, one can possibly benefit from genetic counseling or dissemination of appropriate reading materials to educate other family members.  Finally, at the end of our consultation today, I reinforced the importance of preventive strategies such as avoiding hormonal supplement, avoiding cigarette smoking, keeping up-to-date with screening programs for early cancer detection, frequent ambulation for long distance travel and aggressive DVT prophylaxis in all  surgical settings.  ***I have not made a return appointment for the patient to come back. I would be happy to assist in perioperative DVT management in the future should she need any interruption of her anticoagulation therapy for elective procedures.  No orders of the defined types were placed in this encounter.     This is his first episode of thrombosis, unprovoked.  Hypercoagulopathy workup We discussed about the pros and cons about testing for thrombophilia disorder. his current anticoagulation therapy will interfere with some the tests and it is not possible to interpret the test results.  Taking him off the anticoagulation therapy to do the tests may precipitate another thrombotic event. He has been tested negative for prothrombin gene mutation and factor V Leiden which were not interfered by his anticoagulation. I would consider to complete the rest of hypercoagulation workup after he is off anticoagulation. Mesenteric thrombosis is an uncommon phenomenon, sometime could be related to underlying malignancy or myeloproliferative disorder. He has normal CBC, CT abdomen and pelvis was negative for malignancy, PSA was recently checked which was normal. He never smoked, has low risk for lung cancer.  Duration of anticoagulation Giving the severity of his thrombosis, I recommend continue Xarelto for 6 months. If his factor V Leyden and a prothrombin gene mutation were negative, I will take him off Xarelto after 6 months of therapy, and do the rest of hypercoagulation workup. If he does have hypercoagulopathy, I would recommend continue anticoagulation indefinitely.   On the other hand, giving his unprovoked DVT and family history of thrombosis, it is not unreasonable to continue A/C indefinitely.  Anticoagulation options We discussed about various options of anticoagulation therapies including warfarin, low molecular weight heparin such as Lovenox or newer agents such as Rivaroxaban. Some of  the risks and benefits discussed including costs involved, the need for monitoring, risks of life-threatening bleeding/hospitalization, reversibility of each agent in the event of bleeding or overdose, safety profile of each drug and taking into account other social issues such as ease of administration of medications, etc. Ultimately, we have made an informed decision for the patient to continue his treatment with Xarelto.  Other preventive strategy for DVT  I recommend the patient to use elastic compression stockings at 20-30 mmHg to reduce risks of chronic thrombophlebitis.  Finally, at the end of our consultation today, I reinforced the importance of preventive strategies such as avoiding hormonal supplement, avoiding cigarette smoking, keeping up-to-date with screening programs for early cancer detection, frequent ambulation for long distance travel and aggressive DVT prophylaxis in all surgical settings.  Family counseling Another main issue we discussed today included the role of screening other family members for thrombophilia disorder. At present time, I would not recommend testing the patient's family members as it would not benefit them.  If his hypercarbic Aloxi workup turns out to be positive, and his children are interested in genetic testing, then that would be reasonable.  Follow up -I'll see him back in 5 months. I probably will take him off Xarelto after 6 months of therapy, and complete the hypercoagulable workup 1 months after  that.  All questions were answered. The patient knows to call the clinic with any problems, questions or concerns. I spent {CHL ONC TIME VISIT - ZOXWR:[id number]} counseling the patient face to face. The total time spent in the appointment was {CHL ONC TIME VISIT - BJYNW:[id number]} and more than 50% was on counseling.     Malachy Mood, MD 11/14/2022 7:36 AM

## 2022-11-23 ENCOUNTER — Other Ambulatory Visit: Payer: BC Managed Care – PPO

## 2022-11-23 ENCOUNTER — Encounter: Payer: BC Managed Care – PPO | Admitting: Nurse Practitioner

## 2022-12-05 ENCOUNTER — Encounter: Payer: Self-pay | Admitting: Nurse Practitioner

## 2022-12-16 ENCOUNTER — Ambulatory Visit (INDEPENDENT_AMBULATORY_CARE_PROVIDER_SITE_OTHER): Payer: BC Managed Care – PPO | Admitting: Family Medicine

## 2022-12-16 ENCOUNTER — Ambulatory Visit: Payer: Self-pay | Admitting: Family Medicine

## 2022-12-16 ENCOUNTER — Encounter: Payer: Self-pay | Admitting: Family Medicine

## 2022-12-16 ENCOUNTER — Telehealth: Payer: Self-pay | Admitting: Hematology and Oncology

## 2022-12-16 VITALS — BP 120/82 | HR 99 | Temp 98.4°F | Ht 65.0 in | Wt 287.6 lb

## 2022-12-16 DIAGNOSIS — R7303 Prediabetes: Secondary | ICD-10-CM | POA: Diagnosis not present

## 2022-12-16 DIAGNOSIS — D6869 Other thrombophilia: Secondary | ICD-10-CM

## 2022-12-16 DIAGNOSIS — E782 Mixed hyperlipidemia: Secondary | ICD-10-CM | POA: Diagnosis not present

## 2022-12-16 DIAGNOSIS — E559 Vitamin D deficiency, unspecified: Secondary | ICD-10-CM | POA: Diagnosis not present

## 2022-12-16 DIAGNOSIS — Z6841 Body Mass Index (BMI) 40.0 and over, adult: Secondary | ICD-10-CM

## 2022-12-16 DIAGNOSIS — D696 Thrombocytopenia, unspecified: Secondary | ICD-10-CM

## 2022-12-16 MED ORDER — ZEPBOUND 10 MG/0.5ML ~~LOC~~ SOAJ: 10.0000 mg | SUBCUTANEOUS | 1 refills | Status: DC

## 2022-12-16 MED ORDER — ATORVASTATIN CALCIUM 20 MG PO TABS: 20.0000 mg | ORAL_TABLET | Freq: Every day | ORAL | 11 refills | Status: DC

## 2022-12-16 MED ORDER — METFORMIN HCL ER 500 MG PO TB24: 500.0000 mg | ORAL_TABLET | Freq: Every day | ORAL | 8 refills | Status: DC

## 2022-12-16 NOTE — Telephone Encounter (Signed)
Contacted patient to scheduled appointments. Patient is aware of appointments that are scheduled.   

## 2022-12-16 NOTE — Progress Notes (Unsigned)
Madelaine Bhat, CMA,acting as a Neurosurgeon for [employer], NP.,have documented all relevant documentation on the behalf of  , NP,as directed by   Moshe Salisbury, NP while in the presence of  [facility], NP.  Subjective:  Patient ID: Kristi Frank , female    DOB: 1975-12-29 , 47 y.o.   MRN: [id number]  Chief Complaint  Patient presents with   Weight Check    HPI  Patient presents today for a weight follow up, patient reports compliance with medications. Patient states she had a hysterectomy in 10/2019 d/t fibroids, Patient denies any chest pain, SOB, or headaches. Patient has no other concerns today. Wt Readings from Last 3 Encounters: 12/16/22 : 287 lb 9.6 oz (130.5 kg) 06/23/22 : 283 lb (128.4 kg) 03/18/22 : 255 lb (115.7 kg)       Past Medical History:  Diagnosis Date   Anxiety    Blood clotting disorder (HCC)    Migraine    Prediabetes      Family History  Problem Relation Age of Onset   Epilepsy Mother    Hypertension Mother    Healthy Father    Diabetes Maternal Aunt    Cancer Paternal Uncle    Healthy Maternal Grandmother    Dementia Paternal Grandmother    Heart disease Paternal Grandfather    Clotting disorder Other      Current Outpatient Medications:    rivaroxaban (XARELTO) 20 MG TABS tablet, Take 1 tablet (20 mg total) by mouth daily with dinner, Disp: 30 tablet, Rfl: 5   tirzepatide (ZEPBOUND) 7.5 MG/0.5ML Pen, Inject 7.5 mg into the skin once a week., Disp: 2 mL, Rfl: 2   clobetasol ointment (TEMOVATE) 0.05 %, Apply 1 application topically 2 (two) times daily. (Patient not taking: Reported on 12/16/2022), Disp: 30 g, Rfl: 2   metFORMIN (GLUCOPHAGE-XR) 500 MG 24 hr tablet, Take 2 tablets (1,000 mg total) by mouth 2 (two) times daily with meals (Patient not taking: Reported on 06/23/2022), Disp: 360 tablet, Rfl: 0   nystatin cream (MYCOSTATIN), Apply 1 Application topically 2 (two) times daily. (Patient not taking: Reported on 12/16/2022), Disp: 30 g,  Rfl: 0   No Known Allergies   Review of Systems  Constitutional: Negative.   HENT: Negative.    Eyes: Negative.   Respiratory: Negative.    Cardiovascular: Negative.   Gastrointestinal: Negative.      Today's Vitals   12/16/22 1509  BP: 120/82  Pulse: 99  Temp: 98.4 F (36.9 C)  TempSrc: Oral  Weight: 287 lb 9.6 oz (130.5 kg)  Height: 5\' 5"  (1.651 m)  PainSc: 0-No pain   Body mass index is 47.86 kg/m.  Wt Readings from Last 3 Encounters:  12/16/22 287 lb 9.6 oz (130.5 kg)  06/23/22 283 lb (128.4 kg)  03/18/22 255 lb (115.7 kg)     Objective:  Physical Exam HENT:     Head: Normocephalic.  Cardiovascular:     Pulses: Normal pulses.  Pulmonary:     Effort: Pulmonary effort is normal.  Abdominal:     General: Bowel sounds are normal.  Neurological:     Mental Status: She is alert and oriented to person, place, and time.  Psychiatric:        Mood and Affect: Mood normal.         Assessment And Plan:  Class 3 severe obesity due to excess calories with body mass index (BMI) of 45.0 to 49.9 in adult, unspecified whether serious comorbidity present (HCC)  Return for 2 months weight check.  Patient was given opportunity to ask questions. Patient verbalized understanding of the plan and was able to repeat key elements of the plan. All questions were answered to their satisfaction.    I,  , NP, have reviewed all documentation for this visit. The documentation on 12/16/22 for the exam, diagnosis, procedures, and orders are all accurate and complete.   IF YOU HAVE BEEN REFERRED TO A SPECIALIST, IT MAY TAKE 1-2 WEEKS TO SCHEDULE/PROCESS THE REFERRAL. IF YOU HAVE NOT HEARD FROM US/SPECIALIST IN TWO WEEKS, PLEASE GIVE [place] A CALL AT [phone].

## 2022-12-17 DIAGNOSIS — D6869 Other thrombophilia: Secondary | ICD-10-CM | POA: Insufficient documentation

## 2022-12-17 DIAGNOSIS — E559 Vitamin D deficiency, unspecified: Secondary | ICD-10-CM | POA: Insufficient documentation

## 2022-12-17 DIAGNOSIS — E782 Mixed hyperlipidemia: Secondary | ICD-10-CM | POA: Insufficient documentation

## 2022-12-17 LAB — BMP8+EGFR
BUN/Creatinine Ratio: 8 — ABNORMAL LOW (ref 9–23)
BUN: 7 mg/dL (ref 6–24)
CO2: 23 mmol/L (ref 20–29)
Calcium: 11.1 mg/dL — ABNORMAL HIGH (ref 8.7–10.2)
Chloride: 104 mmol/L (ref 96–106)
Creatinine, Ser: 0.87 mg/dL (ref 0.57–1.00)
Glucose: 84 mg/dL (ref 70–99)
Potassium: 5.1 mmol/L (ref 3.5–5.2)
Sodium: 141 mmol/L (ref 134–144)
eGFR: 84 mL/min/{1.73_m2} (ref >59)

## 2022-12-17 LAB — VITAMIN D 25 HYDROXY (VIT D DEFICIENCY, FRACTURES): Vit D, 25-Hydroxy: 8.2 ng/mL — ABNORMAL LOW (ref 30.0–100.0)

## 2022-12-17 LAB — HEMOGLOBIN A1C
Est. average glucose Bld gHb Est-mCnc: 126 mg/dL
Hgb A1c MFr Bld: 6 % — ABNORMAL HIGH (ref 4.8–5.6)

## 2022-12-17 NOTE — Assessment & Plan Note (Signed)
She is encouraged to strive for BMI less than 30 to decrease cardiac risk. Advised to aim for at least 150 minutes of exercise per week.  

## 2022-12-17 NOTE — Assessment & Plan Note (Signed)
On Xarelto for history of blood clots

## 2022-12-17 NOTE — Assessment & Plan Note (Signed)
Low fat diet advised. Start Lipitor 20mg  every day

## 2023-01-07 ENCOUNTER — Inpatient Hospital Stay: Payer: BC Managed Care – PPO | Attending: Oncology | Admitting: Hematology and Oncology

## 2023-01-07 ENCOUNTER — Inpatient Hospital Stay: Payer: BC Managed Care – PPO

## 2023-01-07 VITALS — BP 142/96 | HR 85 | Temp 97.3°F | Resp 18 | Ht 65.0 in | Wt 285.9 lb

## 2023-01-07 DIAGNOSIS — Z7901 Long term (current) use of anticoagulants: Secondary | ICD-10-CM | POA: Insufficient documentation

## 2023-01-07 DIAGNOSIS — Z79899 Other long term (current) drug therapy: Secondary | ICD-10-CM | POA: Insufficient documentation

## 2023-01-07 DIAGNOSIS — Z86711 Personal history of pulmonary embolism: Secondary | ICD-10-CM | POA: Diagnosis not present

## 2023-01-07 DIAGNOSIS — D6869 Other thrombophilia: Secondary | ICD-10-CM

## 2023-01-07 DIAGNOSIS — I2699 Other pulmonary embolism without acute cor pulmonale: Secondary | ICD-10-CM | POA: Diagnosis present

## 2023-01-07 LAB — ANTITHROMBIN III: AntiThromb III Func: 116 % (ref 75–120)

## 2023-01-07 NOTE — Progress Notes (Signed)
[facility] CONSULT NOTE  Patient Care Team: Arnette Felts, FNP as PCP - General (General Practice)  CHIEF COMPLAINTS/PURPOSE OF CONSULTATION:  History of recurrent pulmonary emboli  HISTORY OF PRESENTING ILLNESS:  Kristi Frank 47 y.o. female is here because of history of recurrent pulmonary emboli.  She was previously seen and treated at [facility].  She was diagnosed with saddle pulmonary embolism in 2001 when she presented with syncope and chest pain and palpitations.  She went on anticoagulation with Coumadin up until 2018.  She discontinued anticoagulation in 2019 she developed new blood clot.  She was then treated with Xarelto until 2022.  She moved to [place] in 2020 and works for [employer].  She works as a Designer, multimedia.  She tells me that her work requires her to be at a desk the whole day.  She is taking Zepbound for weight loss.  She was referred to [place] for discussion regarding the role of anticoagulation.  I reviewed her records extensively and collaborated the history with the patient.   MEDICAL HISTORY:  Past Medical History:  Diagnosis Date   Anxiety    Blood clotting disorder (HCC)    Migraine    Prediabetes     SURGICAL HISTORY: Past Surgical History:  Procedure Laterality Date   ABDOMINAL HYSTERECTOMY     BREAST REDUCTION SURGERY     CESAREAN SECTION     FOOT SURGERY      SOCIAL HISTORY: Social History   Socioeconomic History   Marital status: Single    Spouse name: Not on file   Number of children: Not on file   Years of education: Not on file   Highest education level: Not on file  Occupational History   Not on file  Tobacco Use   Smoking status: Never   Smokeless tobacco: Never  Substance and Sexual Activity   Alcohol use: Yes    Comment: socially    Drug use: Never   Sexual activity: Not on file  Other Topics Concern   Not on file  Social History Narrative   Not on file   Social  Determinants of Health   Financial Resource Strain: Not on file  Food Insecurity: Not on file  Transportation Needs: Not on file  Physical Activity: Not on file  Stress: Not on file  Social Connections: Not on file  Intimate Partner Violence: Not on file    FAMILY HISTORY: Family History  Problem Relation Age of Onset   Epilepsy Mother    Hypertension Mother    Healthy Father    Diabetes Maternal Aunt    Cancer Paternal Uncle    Healthy Maternal Grandmother    Dementia Paternal Grandmother    Heart disease Paternal Grandfather    Clotting disorder Other     ALLERGIES:  has No Known Allergies.  MEDICATIONS:  Current Outpatient Medications  Medication Sig Dispense Refill   atorvastatin (LIPITOR) 20 MG tablet Take 1 tablet (20 mg total) by mouth daily. 30 tablet 11   clobetasol ointment (TEMOVATE) 0.05 % Apply 1 application topically 2 (two) times daily. (Patient not taking: Reported on 12/16/2022) 30 g 2   metFORMIN (GLUCOPHAGE-XR) 500 MG 24 hr tablet Take 1 tablet (500 mg total) by mouth daily with breakfast. 30 tablet 8   nystatin cream (MYCOSTATIN) Apply 1 Application topically 2 (two) times daily. (Patient not taking: Reported on 12/16/2022) 30 g 0   rivaroxaban (XARELTO) 20 MG TABS tablet Take 1 tablet (  20 mg total) by mouth daily with dinner 30 tablet 5   tirzepatide (ZEPBOUND) 10 MG/0.5ML Pen Inject 10 mg into the skin once a week. 2 mL 1   No current facility-administered medications for this visit.    REVIEW OF SYSTEMS:   Constitutional: Denies fevers, chills or abnormal night sweats All other systems were reviewed with the patient and are negative.  PHYSICAL EXAMINATION: ECOG PERFORMANCE STATUS: 1 - Symptomatic but completely ambulatory  Vitals:   01/07/23 1304  BP: (!) 142/96  Pulse: 85  Resp: 18  Temp: (!) 97.3 F (36.3 C)  SpO2: 100%   Filed Weights   01/07/23 1304  Weight: 285 lb 14.4 oz (129.7 kg)    GENERAL:alert, no distress and  comfortable  LABORATORY DATA:  I have reviewed the data as listed Lab Results  Component Value Date   WBC 4.9 06/23/2022   HGB 14.1 06/23/2022   HCT 42.8 06/23/2022   MCV 88 06/23/2022   PLT 257 06/23/2022   Lab Results  Component Value Date   NA 141 12/16/2022   K 5.1 12/16/2022   CL 104 12/16/2022   CO2 23 12/16/2022    RADIOGRAPHIC STUDIES: I have personally reviewed the radiological reports and agreed with the findings in the report.  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  Plan: We will obtain hypercoagulability panel today.  Patient's risk factors include sedentary nests at work and obesity.  We will assess to see if there is any other risk factors.  Currently on Zepbound for weight loss.  Telephone visit in 2 weeks to discuss results  All questions were answered. The patient knows to call the clinic with any problems, questions or concerns.    Tamsen Meek, MD 01/07/23

## 2023-01-08 LAB — BETA-2-GLYCOPROTEIN I ABS, IGG/M/A
Beta-2 Glyco I IgG: <9 GPI IgG units (ref 0–20)
Beta-2-Glycoprotein I IgA: <9 GPI IgA units (ref 0–25)
Beta-2-Glycoprotein I IgM: <9 GPI IgM units (ref 0–32)

## 2023-01-08 LAB — PROTEIN C, TOTAL: Protein C, Total: 127 % (ref 60–150)

## 2023-01-08 LAB — LUPUS ANTICOAGULANT PANEL
DRVVT: 40.2 s (ref 0.0–47.0)
PTT Lupus Anticoagulant: 34.5 s (ref 0.0–43.5)

## 2023-01-08 LAB — PROTEIN S, TOTAL: Protein S Ag, Total: 89 % (ref 60–150)

## 2023-01-08 LAB — FACTOR 8 ASSAY: Coagulation Factor VIII: 166 % — ABNORMAL HIGH (ref 56–140)

## 2023-01-09 LAB — CARDIOLIPIN ANTIBODIES, IGG, IGM, IGA
Anticardiolipin IgA: <9 U/mL (ref 0–11)
Anticardiolipin IgG: <9 GPL U/mL (ref 0–14)
Anticardiolipin IgM: <9 [MPL'U]/mL (ref 0–12)

## 2023-01-13 LAB — FACTOR 5 LEIDEN

## 2023-01-18 LAB — PROTHROMBIN GENE MUTATION

## 2023-02-04 ENCOUNTER — Emergency Department (HOSPITAL_BASED_OUTPATIENT_CLINIC_OR_DEPARTMENT_OTHER): Payer: BC Managed Care – PPO

## 2023-02-04 ENCOUNTER — Encounter (HOSPITAL_BASED_OUTPATIENT_CLINIC_OR_DEPARTMENT_OTHER): Payer: Self-pay | Admitting: Emergency Medicine

## 2023-02-04 ENCOUNTER — Emergency Department (HOSPITAL_BASED_OUTPATIENT_CLINIC_OR_DEPARTMENT_OTHER): Payer: BC Managed Care – PPO | Admitting: Radiology

## 2023-02-04 ENCOUNTER — Emergency Department (HOSPITAL_BASED_OUTPATIENT_CLINIC_OR_DEPARTMENT_OTHER)
Admission: EM | Admit: 2023-02-04 | Discharge: 2023-02-04 | Disposition: A | Discharge status: Home / Self Care | Payer: BC Managed Care – PPO | Attending: Emergency Medicine | Admitting: Emergency Medicine

## 2023-02-04 ENCOUNTER — Other Ambulatory Visit: Payer: Self-pay

## 2023-02-04 DIAGNOSIS — Z7901 Long term (current) use of anticoagulants: Secondary | ICD-10-CM | POA: Diagnosis not present

## 2023-02-04 DIAGNOSIS — E119 Type 2 diabetes mellitus without complications: Secondary | ICD-10-CM | POA: Insufficient documentation

## 2023-02-04 DIAGNOSIS — R0789 Other chest pain: Secondary | ICD-10-CM | POA: Insufficient documentation

## 2023-02-04 DIAGNOSIS — Z7984 Long term (current) use of oral hypoglycemic drugs: Secondary | ICD-10-CM | POA: Diagnosis not present

## 2023-02-04 DIAGNOSIS — R079 Chest pain, unspecified: Secondary | ICD-10-CM | POA: Diagnosis present

## 2023-02-04 HISTORY — DX: Type 2 diabetes mellitus without complications: E11.9

## 2023-02-04 LAB — TROPONIN I (HIGH SENSITIVITY)
Troponin I (High Sensitivity): <2 ng/L (ref <18)
Troponin I (High Sensitivity): <2 ng/L (ref <18)

## 2023-02-04 LAB — CBC
HCT: 40.3 % (ref 36.0–46.0)
Hemoglobin: 13.2 g/dL (ref 12.0–15.0)
MCH: 29 pg (ref 26.0–34.0)
MCHC: 32.8 g/dL (ref 30.0–36.0)
MCV: 88.6 fL (ref 80.0–100.0)
Platelets: 217 10*3/uL (ref 150–400)
RBC: 4.55 MIL/uL (ref 3.87–5.11)
RDW: 12.9 % (ref 11.5–15.5)
WBC: 5.1 10*3/uL (ref 4.0–10.5)
nRBC: 0 % (ref 0.0–0.2)

## 2023-02-04 LAB — BASIC METABOLIC PANEL
Anion gap: 7 (ref 5–15)
BUN: 8 mg/dL (ref 6–20)
CO2: 26 mmol/L (ref 22–32)
Calcium: 10.3 mg/dL (ref 8.9–10.3)
Chloride: 107 mmol/L (ref 98–111)
Creatinine, Ser: 0.71 mg/dL (ref 0.44–1.00)
GFR, Estimated: >60 mL/min (ref >60)
Glucose, Bld: 95 mg/dL (ref 70–99)
Potassium: 3.7 mmol/L (ref 3.5–5.1)
Sodium: 140 mmol/L (ref 135–145)

## 2023-02-04 MED ORDER — IOHEXOL 350 MG/ML SOLN
100.0000 mL | Freq: Once | INTRAVENOUS | Status: AC | PRN
  Administered 2023-02-04: 100 mL via INTRAVENOUS

## 2023-02-04 NOTE — ED Triage Notes (Signed)
Pt c/o LT side CP with "a little shob" x 1 week, worsening x 3 days, radiating to LT back yesterday

## 2023-02-04 NOTE — ED Provider Notes (Signed)
EMERGENCY DEPARTMENT AT [facility] Provider Note   CSN: [id number] Arrival date & time: 02/04/23  4098     History  Chief Complaint  Patient presents with   Chest Pain    Kristi Frank is a 47 y.o. female with history of obesity, diabetes, history of PE in 2019, presents with concern for pain behind her left breast that started about a week ago but worsening in the last day or 2.  States it has been localized to this area and sometimes she feels it in her upper back. It is constant.  Nothing seems to make it better or worse.  Pain is not pleuritic, does not worsen with exertion, and is not positional.  Denies any shortness of breath, nausea or vomiting, jaw pain, arm pain.  Denies any injuries to this area.  Denies any calf pain or calf swelling.  Denies any recent periods of immobilization, recent hospitalizations, recent surgeries, hormonal therapies.   Chest Pain      Home Medications Prior to Admission medications   Medication Sig Start Date End Date Taking? Authorizing Provider  atorvastatin (LIPITOR) 20 MG tablet Take 1 tablet (20 mg total) by mouth daily. 12/16/22 12/16/23  Ellender Hose, NP  clobetasol ointment (TEMOVATE) 0.05 % Apply 1 application topically 2 (two) times daily. Patient not taking: Reported on 12/16/2022 11/11/20   Arnette Felts, FNP  metFORMIN (GLUCOPHAGE-XR) 500 MG 24 hr tablet Take 1 tablet (500 mg total) by mouth daily with breakfast. 12/16/22 09/12/23  Ellender Hose, NP  nystatin cream (MYCOSTATIN) Apply 1 Application topically 2 (two) times daily. Patient not taking: Reported on 12/16/2022 02/18/22   Arnette Felts, FNP  rivaroxaban (XARELTO) 20 MG TABS tablet Take 1 tablet (20 mg total) by mouth daily with dinner 06/24/22   Arnette Felts, FNP  tirzepatide [facility]) 10 MG/0.5ML Pen Inject 10 mg into the skin once a week. 12/16/22   Ellender Hose, NP      Allergies    Patient has no known allergies.    Review of Systems   Review of  Systems  Cardiovascular:  Positive for chest pain.    Physical Exam Updated Vital Signs BP 129/80   Pulse 75   Temp 98.1 F (36.7 C)   Resp 18   Wt 127.9 kg   SpO2 100%   BMI 46.93 kg/m  Physical Exam Vitals and nursing note reviewed.  Constitutional:      General: She is not in acute distress.    Appearance: She is well-developed.  HENT:     Head: Normocephalic and atraumatic.  Eyes:     Conjunctiva/sclera: Conjunctivae normal.  Cardiovascular:     Rate and Rhythm: Normal rate and regular rhythm.     Heart sounds: No murmur heard.    Comments: Radial pulses 2+ bilaterally Pulmonary:     Effort: Pulmonary effort is normal. No respiratory distress.     Breath sounds: Normal breath sounds.  Abdominal:     Palpations: Abdomen is soft.     Tenderness: There is no abdominal tenderness.  Musculoskeletal:        General: No swelling.     Cervical back: Neck supple.  Skin:    General: Skin is warm and dry.     Capillary Refill: Capillary refill takes less than 2 seconds.  Neurological:     Mental Status: She is alert.  Psychiatric:        Mood and Affect: Mood normal.     ED Results /  Procedures / Treatments   Labs (all labs ordered are listed, but only abnormal results are displayed) Labs Reviewed  BASIC METABOLIC PANEL  CBC  TROPONIN I (HIGH SENSITIVITY)  TROPONIN I (HIGH SENSITIVITY)    EKG EKG Interpretation Date/Time:  Thursday February 04 2023 09:47:31 EDT Ventricular Rate:  85 PR Interval:  187 QRS Duration:  94 QT Interval:  349 QTC Calculation: 415 R Axis:   2  Text Interpretation: Sinus rhythm Confirmed by Linwood Dibbles [phone]) on 02/04/2023 9:49:52 AM  Radiology CT Angio Chest PE W and/or Wo Contrast  Result Date: 02/04/2023 CLINICAL DATA:  Left-sided chest pain, back pain and shortness of breath EXAM: CT ANGIOGRAPHY CHEST WITH CONTRAST TECHNIQUE: Multidetector CT imaging of the chest was performed using the standard protocol during bolus  administration of intravenous contrast. Multiplanar CT image reconstructions and MIPs were obtained to evaluate the vascular anatomy. RADIATION DOSE REDUCTION: This exam was performed according to the departmental dose-optimization program which includes automated exposure control, adjustment of the mA and/or kV according to patient size and/or use of iterative reconstruction technique. CONTRAST:  OMNIPAQUE IOHEXOL 350 MG/ML SOLN COMPARISON:  None Available. FINDINGS: Cardiovascular: No evidence of central pulmonary embolus. Evaluation of the segmental and subsegmental pulmonary arteries is limited due to bolus timing. Normal heart size. Trace No pericardial effusion. Normal caliber thoracic aorta with no atherosclerotic disease. Mediastinum/Nodes: Esophagus and thyroid are unremarkable. No enlarged lymph nodes seen in the chest. Lungs/Pleura: Central airways are patent. No consolidation, pleural effusion or pneumothorax. Upper Abdomen: No acute abnormality. Musculoskeletal: No chest wall abnormality. No acute or significant osseous findings. Review of the MIP images confirms the above findings. IMPRESSION: 1. No evidence of central pulmonary embolus. Evaluation of the segmental and subsegmental pulmonary arteries is limited due to bolus timing. 2. No acute findings in the chest. Electronically Signed   By: Allegra Lai M.D.   On: 02/04/2023 15:12   DG Chest 2 View  Result Date: 02/04/2023 CLINICAL DATA:  Chest pain EXAM: CHEST - 2 VIEW COMPARISON:  None Available. FINDINGS: No consolidation, pneumothorax or effusion. No edema. Normal cardiopericardial silhouette. Overlapping cardiac leads. IMPRESSION: No acute cardiopulmonary disease. Electronically Signed   By: Karen Kays M.D.   On: 02/04/2023 10:46    Procedures Procedures    Medications Ordered in ED Medications  iohexol (OMNIPAQUE) 350 MG/ML injection 100 mL (100 mLs Intravenous Contrast Given 02/04/23 1241)    ED Course/ Medical  Decision Making/ A&P                                 Medical Decision Making Amount and/or Complexity of Data Reviewed Labs: ordered. Radiology: ordered.  Risk Prescription drug management.   47 y.o. female with pertinent past medical history of  obesity, diabetes, history of PE in 2019 presents to the ED for concern of constant pain behind the left breast for 1 week, worsening in the last 1-2 days   Differential diagnosis includes but is not limited to ACS, PE, pneumonia, costochondritis, musculoskeletal pain  ED Course:  Patient overall well-appearing, nondiaphoretic, with normal vital signs.  No calf pain or swelling.  Regular rate and rhythm on cardiac auscultation.  She reports she symptoms feel this the pain behind her left breast also in her upper back.  Could consider dissection, however, feel that since has been going on for a week and well appearing here, less likely.  Non-exertional, non-pleuritic. Wells criteria  1.5 due to prior PE, but low clinical suspicion for PE today.  CBC, BMP unremarkable.  First troponin under 2, repeat under 2.  EKG with normal sinus rhythm, no ischemic changes.  Low suspicion for ACS at this time.  Chest x-ray without any acute findings, no mediastinal widening.  Upon reevaluation, patient still well-appearing with stable vitals.  Given unremarkable workup, will obtain CTA chest to rule out PE given patient's history. Upon re-evaluation, patient still well-appearing with normal vital signs.  CTA chest with no evidence of PE.  Given patient stable appearing and unremarkable workup, appropriate for discharge at this time.  Impression: Left-sided chest pain  Disposition:  The patient was discharged home with instructions to follow-up with PCP within the next week for reevaluation. Return precautions given.  Lab Tests: I Ordered, and personally interpreted labs.  The pertinent results include:   CBC, BMP unremarkable First troponin under 2, repeat  under 2   Imaging Studies ordered: I ordered imaging studies including chest x-ray I independently visualized the imaging with scope of interpretation limited to determining acute life threatening conditions related to emergency care. Imaging showed  no acute abnormalities I agree with the radiologist interpretation   Cardiac Monitoring: / EKG: The patient was maintained on a cardiac monitor.  I personally viewed and interpreted the cardiac monitored which showed an underlying rhythm of: Normal sinus rhythm   External records from outside source obtained and reviewed including CV procedures, no prior cardiac caths or Echocardiograms   Co morbidities that complicate the patient evaluation   obesity, diabetes, history of PE in 2019             Final Clinical Impression(s) / ED Diagnoses Final diagnoses:  Left-sided chest pain    Rx / DC Orders ED Discharge Orders     None         Arabella Merles, PA-C 02/04/23 1534    Linwood Dibbles, MD 02/05/23 [phone]

## 2023-02-04 NOTE — Discharge Instructions (Addendum)
Your workup here is reassuring today. The cause of your chest pain is unclear. However, your EKG and cardiac enzyme (troponin) are normal. Your CT scan does not show any blood clots in your lungs. Your chest x-ray is normal.  Please follow-up with your PCP within the next week for repeat evaluation.  Return to the ER if you have any worsening chest pain, shortness of breath or difficulty breathing, dizziness, any other new or concerning symptoms.

## 2023-02-12 ENCOUNTER — Other Ambulatory Visit: Payer: Self-pay

## 2023-02-12 NOTE — Transitions of Care (Post Inpatient/ED Visit) (Signed)
02/12/2023  Name: Kristi Frank MRN: [id number] DOB: 30-May-1975  Today's TOC FU Call Status: Today's TOC FU Call Status:: Unsuccessful Call (1st Attempt) Unsuccessful Call (1st Attempt) Date: 02/12/23  Attempted to reach the patient regarding the most recent Inpatient/ED visit.  Follow Up Plan: Additional outreach attempts will be made to reach the patient to complete the Transitions of Care (Post Inpatient/ED visit) call.   Signature  Lisabeth Devoid, [place]

## 2023-02-16 ENCOUNTER — Ambulatory Visit: Payer: BC Managed Care – PPO | Admitting: Nurse Practitioner

## 2023-02-16 NOTE — Progress Notes (Deleted)
Madelaine Bhat, CMA,acting as a Neurosurgeon for Arnette Felts, FNP.,have documented all relevant documentation on the behalf of Arnette Felts, FNP,as directed by  Arnette Felts, FNP while in the presence of Arnette Felts, FNP.  Subjective:  Patient ID: Kristi Frank , female    DOB: 27-Jun-1975 , 47 y.o.   MRN: [id number]  No chief complaint on file.   HPI  Patient presents today for a weight check  follow up, Patient reports compliance with medication. Patient denies any chest pain, SOB, or headaches. Patient has no concerns today.     Past Medical History:  Diagnosis Date  . Anxiety   . Blood clotting disorder (HCC)   . Diabetes mellitus without complication (HCC)   . Migraine   . Prediabetes      Family History  Problem Relation Age of Onset  . Epilepsy Mother   . Hypertension Mother   . Healthy Father   . Diabetes Maternal Aunt   . Cancer Paternal Uncle   . Healthy Maternal Grandmother   . Dementia Paternal Grandmother   . Heart disease Paternal Grandfather   . Clotting disorder Other      Current Outpatient Medications:  .  atorvastatin (LIPITOR) 20 MG tablet, Take 1 tablet (20 mg total) by mouth daily., Disp: 30 tablet, Rfl: 11 .  clobetasol ointment (TEMOVATE) 0.05 %, Apply 1 application topically 2 (two) times daily. (Patient not taking: Reported on 12/16/2022), Disp: 30 g, Rfl: 2 .  metFORMIN (GLUCOPHAGE-XR) 500 MG 24 hr tablet, Take 1 tablet (500 mg total) by mouth daily with breakfast., Disp: 30 tablet, Rfl: 8 .  nystatin cream (MYCOSTATIN), Apply 1 Application topically 2 (two) times daily. (Patient not taking: Reported on 12/16/2022), Disp: 30 g, Rfl: 0 .  rivaroxaban (XARELTO) 20 MG TABS tablet, Take 1 tablet (20 mg total) by mouth daily with dinner, Disp: 30 tablet, Rfl: 5 .  tirzepatide (ZEPBOUND) 10 MG/0.5ML Pen, Inject 10 mg into the skin once a week., Disp: 2 mL, Rfl: 1   No Known Allergies   Review of Systems   There were no vitals filed for this  visit. There is no height or weight on file to calculate BMI.  Wt Readings from Last 3 Encounters:  02/04/23 282 lb (127.9 kg)  01/07/23 285 lb 14.4 oz (129.7 kg)  12/16/22 287 lb 9.6 oz (130.5 kg)    The 10-year ASCVD risk score (Arnett DK, et al., 2019) is: 0.5%   Values used to calculate the score:     Age: 42 years     Sex: Female     Is Non-Hispanic African American: Yes     Diabetic: No     Tobacco smoker: No     Systolic Blood Pressure: 129 mmHg     Is BP treated: No     HDL Cholesterol: 92 mg/dL     Total Cholesterol: 225 mg/dL  Objective:  Physical Exam      Assessment And Plan:  There are no diagnoses linked to this encounter.  No follow-ups on file.  Patient was given opportunity to ask questions. Patient verbalized understanding of the plan and was able to repeat key elements of the plan. All questions were answered to their satisfaction.    Jeanell Sparrow, FNP, have reviewed all documentation for this visit. The documentation on 02/16/23 for the exam, diagnosis, procedures, and orders are all accurate and complete.   IF YOU HAVE BEEN REFERRED TO A SPECIALIST, IT MAY TAKE 1-2  WEEKS TO SCHEDULE/PROCESS THE REFERRAL. IF YOU HAVE NOT HEARD FROM US/SPECIALIST IN TWO WEEKS, PLEASE GIVE [place] A CALL AT [phone].

## 2023-02-24 ENCOUNTER — Encounter: Payer: Self-pay | Admitting: Nurse Practitioner

## 2023-03-04 ENCOUNTER — Ambulatory Visit: Payer: BC Managed Care – PPO | Admitting: Family Medicine

## 2023-03-04 ENCOUNTER — Encounter: Payer: Self-pay | Admitting: Family Medicine

## 2023-03-04 VITALS — BP 120/84 | Temp 98.4°F | Ht 65.0 in | Wt 279.0 lb

## 2023-03-04 DIAGNOSIS — Z6841 Body Mass Index (BMI) 40.0 and over, adult: Secondary | ICD-10-CM

## 2023-03-04 DIAGNOSIS — Z23 Encounter for immunization: Secondary | ICD-10-CM | POA: Insufficient documentation

## 2023-03-04 DIAGNOSIS — E66813 Obesity, class 3: Secondary | ICD-10-CM | POA: Diagnosis not present

## 2023-03-04 DIAGNOSIS — F411 Generalized anxiety disorder: Secondary | ICD-10-CM

## 2023-03-04 DIAGNOSIS — D6869 Other thrombophilia: Secondary | ICD-10-CM | POA: Diagnosis not present

## 2023-03-04 DIAGNOSIS — Z86711 Personal history of pulmonary embolism: Secondary | ICD-10-CM

## 2023-03-04 MED ORDER — ESCITALOPRAM OXALATE 10 MG PO TABS
10.0000 mg | ORAL_TABLET | Freq: Every day | ORAL | 3 refills | Status: DC
Start: 2023-03-04 — End: 2024-01-26

## 2023-03-04 MED ORDER — RIVAROXABAN 20 MG PO TABS
20.0000 mg | ORAL_TABLET | Freq: Every day | ORAL | 5 refills | Status: DC
Start: 2023-03-04 — End: 2024-01-26

## 2023-03-04 MED ORDER — ZEPBOUND 12.5 MG/0.5ML ~~LOC~~ SOAJ
12.5000 mg | SUBCUTANEOUS | 0 refills | Status: DC
Start: 2023-03-04 — End: 2023-03-05

## 2023-03-04 NOTE — Assessment & Plan Note (Signed)
On xarelto 20 mg every day d/t 4 PE's in the past

## 2023-03-04 NOTE — Progress Notes (Signed)
I,Jameka J Llittleton, CMA,acting as a Neurosurgeon for [employer], NP.,have documented all relevant documentation on the behalf of Kristi Hose, NP,as directed by  Kristi Hose, NP while in the presence of Kristi Hose, NP.  Subjective:  Patient ID: Kristi Frank , female    DOB: 1975/12/09 , 47 y.o.   MRN: [id number]  Chief Complaint  Patient presents with   Weight Check    HPI  Patient presents today for a weight management. She is currently taking Zepbound 10mg /0.52ml SQ Q 7 days. She reports compliance with her meds and denies any side effect. She will be getting a dose increase today. Patient states that 02/04/2023, she was in the ER for chest pain, troponin checked and they were unremarkable, but patient states that in 2011 when she had a pulmonary embolism, she has been suffering from anxiety and was on Escitalopram daily but she stopped taking it some years ago.  Patient is supposed to be Xarelto 20mg  every day but she reports that she has not had any since last year. Patient was advised to take all medicines as prescribed unless otherwise told by the doctorSport and exercise psychologist)     Past Medical History:  Diagnosis Date   Anxiety    Blood clotting disorder (HCC)    Diabetes mellitus without complication (HCC)    Migraine    Prediabetes      Family History  Problem Relation Age of Onset   Epilepsy Mother    Hypertension Mother    Healthy Father    Diabetes Maternal Aunt    Cancer Paternal Uncle    Healthy Maternal Grandmother    Dementia Paternal Grandmother    Heart disease Paternal Grandfather    Clotting disorder Other      Current Outpatient Medications:    atorvastatin (LIPITOR) 20 MG tablet, Take 1 tablet (20 mg total) by mouth daily., Disp: 30 tablet, Rfl: 11   escitalopram (LEXAPRO) 10 MG tablet, Take 1 tablet (10 mg total) by mouth daily., Disp: 30 tablet, Rfl: 3   metFORMIN (GLUCOPHAGE-XR) 500 MG 24 hr tablet, Take 1 tablet (500 mg total) by mouth daily with breakfast.,  Disp: 30 tablet, Rfl: 8   tirzepatide (ZEPBOUND) 12.5 MG/0.5ML Pen, Inject 12.5 mg into the skin once a week., Disp: 84 mL, Rfl: 0   rivaroxaban (XARELTO) 20 MG TABS tablet, Take 1 tablet (20 mg total) by mouth daily with dinner, Disp: 30 tablet, Rfl: 5   No Known Allergies   Review of Systems  Constitutional: Negative.   Respiratory: Negative.    Cardiovascular: Negative.  Negative for chest pain and palpitations.  Gastrointestinal: Negative.   Endocrine: Negative.   Musculoskeletal: Negative.   Psychiatric/Behavioral:  The patient is nervous/anxious.      Today's Vitals   03/04/23 0936  BP: 120/84  Temp: 98.4 F (36.9 C)  Weight: 279 lb (126.6 kg)  Height: 5\' 5"  (1.651 m)  PainSc: 0-No pain   Body mass index is 46.43 kg/m.  Wt Readings from Last 3 Encounters:  03/04/23 279 lb (126.6 kg)  02/04/23 282 lb (127.9 kg)  01/07/23 285 lb 14.4 oz (129.7 kg)    The 10-year ASCVD risk score (Arnett DK, et al., 2019) is: 0.3%   Values used to calculate the score:     Age: 66 years     Sex: Female     Is Non-Hispanic African American: Yes     Diabetic: No     Tobacco smoker: No  Systolic Blood Pressure: 120 mmHg     Is BP treated: No     HDL Cholesterol: 92 mg/dL     Total Cholesterol: 225 mg/dL  Objective:  Physical Exam HENT:     Head: Normocephalic.  Cardiovascular:     Rate and Rhythm: Normal rate and regular rhythm.  Pulmonary:     Effort: Pulmonary effort is normal.     Breath sounds: Normal breath sounds.  Abdominal:     General: Bowel sounds are normal.  Skin:    General: Skin is warm and dry.  Neurological:     Mental Status: She is alert and oriented to person, place, and time.         Assessment And Plan:  Class 3 severe obesity due to excess calories with body mass index (BMI) of 45.0 to 49.9 in adult, unspecified whether serious comorbidity present (HCC) -     Zepbound; Inject 12.5 mg into the skin once a week.  Dispense: 84 mL; Refill:  0  Immunization due -     Flu vaccine trivalent PF, 6mos and older(Flulaval,Afluria,Fluarix,Fluzone)  GAD (generalized anxiety disorder) -     Escitalopram Oxalate; Take 1 tablet (10 mg total) by mouth daily.  Dispense: 30 tablet; Refill: 3  Acquired thrombophilia (HCC) Assessment & Plan: On xarelto 20 mg every day d/t 4 PE's in the past   History of pulmonary embolism -     Rivaroxaban; Take 1 tablet (20 mg total) by mouth daily with dinner  Dispense: 30 tablet; Refill: 5    Return in about 3 months (around 06/04/2023) for weigh check.  Patient was given opportunity to ask questions. Patient verbalized understanding of the plan and was able to repeat key elements of the plan. All questions were answered to their satisfaction.    I, Kristi Hose, NP, have reviewed all documentation for this visit. The documentation on 03/04/2023  for the exam, diagnosis, procedures, and orders are all accurate and complete.   IF YOU HAVE BEEN REFERRED TO A SPECIALIST, IT MAY TAKE 1-2 WEEKS TO SCHEDULE/PROCESS THE REFERRAL. IF YOU HAVE NOT HEARD FROM US/SPECIALIST IN TWO WEEKS, PLEASE GIVE [place] A CALL AT [phone].

## 2023-03-05 ENCOUNTER — Other Ambulatory Visit: Payer: Self-pay

## 2023-03-05 MED ORDER — ZEPBOUND 12.5 MG/0.5ML ~~LOC~~ SOAJ
12.5000 mg | SUBCUTANEOUS | 0 refills | Status: DC
Start: 2023-03-05 — End: 2023-04-19

## 2023-03-10 ENCOUNTER — Other Ambulatory Visit: Payer: Self-pay | Admitting: Family Medicine

## 2023-03-10 MED ORDER — VITAMIN D (ERGOCALCIFEROL) 1.25 MG (50000 UNIT) PO CAPS: 50000.0000 [IU] | ORAL_CAPSULE | ORAL | 2 refills | Status: DC

## 2023-03-15 ENCOUNTER — Encounter: Payer: Self-pay | Admitting: Family Medicine

## 2023-03-17 ENCOUNTER — Telehealth: Payer: Self-pay

## 2023-03-17 NOTE — Telephone Encounter (Signed)
PA for Zepbound 12.5mg  has been approved through 11/12/23. YL,RMA

## 2023-03-24 NOTE — Telephone Encounter (Signed)
Telephone call  

## 2023-04-19 ENCOUNTER — Other Ambulatory Visit: Payer: Self-pay | Admitting: Family Medicine

## 2023-04-19 DIAGNOSIS — E66813 Obesity, class 3: Secondary | ICD-10-CM

## 2023-04-19 LAB — HM COLONOSCOPY

## 2023-04-21 ENCOUNTER — Encounter: Payer: Self-pay | Admitting: Nurse Practitioner

## 2023-04-30 MED ORDER — ZEPBOUND 12.5 MG/0.5ML ~~LOC~~ SOAJ: 12.5000 mg | SUBCUTANEOUS | 0 refills | Status: DC

## 2023-05-10 ENCOUNTER — Telehealth: Payer: Self-pay

## 2023-05-10 DIAGNOSIS — E66813 Obesity, class 3: Secondary | ICD-10-CM

## 2023-05-10 MED ORDER — ZEPBOUND 12.5 MG/0.5ML ~~LOC~~ SOAJ
12.5000 mg | SUBCUTANEOUS | 0 refills | Status: DC
Start: 2023-05-10 — End: 2023-06-04

## 2023-05-10 NOTE — Telephone Encounter (Signed)
refill 

## 2023-06-02 ENCOUNTER — Encounter: Payer: Self-pay | Admitting: Nurse Practitioner

## 2023-06-04 ENCOUNTER — Other Ambulatory Visit: Payer: Self-pay

## 2023-06-04 MED ORDER — ZEPBOUND 12.5 MG/0.5ML ~~LOC~~ SOAJ: 12.5000 mg | SUBCUTANEOUS | 0 refills | Status: DC

## 2023-06-08 ENCOUNTER — Ambulatory Visit: Payer: BC Managed Care – PPO | Admitting: Nurse Practitioner

## 2023-06-29 ENCOUNTER — Ambulatory Visit: Payer: BC Managed Care – PPO | Admitting: Nurse Practitioner

## 2023-06-29 ENCOUNTER — Encounter: Payer: Self-pay | Admitting: Nurse Practitioner

## 2023-06-29 VITALS — BP 120/70 | HR 91 | Temp 98.5°F | Ht 65.0 in | Wt 292.0 lb

## 2023-06-29 DIAGNOSIS — E66813 Obesity, class 3: Secondary | ICD-10-CM

## 2023-06-29 DIAGNOSIS — Z6841 Body Mass Index (BMI) 40.0 and over, adult: Secondary | ICD-10-CM

## 2023-06-29 DIAGNOSIS — E782 Mixed hyperlipidemia: Secondary | ICD-10-CM

## 2023-06-29 DIAGNOSIS — F411 Generalized anxiety disorder: Secondary | ICD-10-CM | POA: Diagnosis not present

## 2023-06-29 DIAGNOSIS — E559 Vitamin D deficiency, unspecified: Secondary | ICD-10-CM

## 2023-06-29 DIAGNOSIS — Z79899 Other long term (current) drug therapy: Secondary | ICD-10-CM

## 2023-06-29 DIAGNOSIS — R7303 Prediabetes: Secondary | ICD-10-CM

## 2023-06-29 DIAGNOSIS — Z0000 Encounter for general adult medical examination without abnormal findings: Secondary | ICD-10-CM

## 2023-06-29 DIAGNOSIS — D500 Iron deficiency anemia secondary to blood loss (chronic): Secondary | ICD-10-CM

## 2023-06-29 MED ORDER — ZEPBOUND 12.5 MG/0.5ML ~~LOC~~ SOAJ
12.5000 mg | SUBCUTANEOUS | 1 refills | Status: DC
Start: 2023-06-29 — End: 2024-01-26

## 2023-06-29 NOTE — Progress Notes (Unsigned)
 Madelaine Bhat, CMA,acting as a Neurosurgeon for Arnette Felts, FNP.,have documented all relevant documentation on the behalf of Arnette Felts, FNP,as directed by  Arnette Felts, FNP while in the presence of Arnette Felts, FNP.  Subjective:    Patient ID: Kristi Frank , female    DOB: 11-06-1975 , 48 y.o.   MRN: [id number]  Chief Complaint  Patient presents with   Annual Exam    HPI  Patient presents today for a physical. Patient reports compliance with her meds. Patient denies having chest pain, sob or headaches at this time. Patient doesn't have any questions or concerns at this time. She has not been on the medication consistently for about 3 months, she had her last shot today.   She is not eating much due to take Zepbound. She is tolerating well.      Past Medical History:  Diagnosis Date   Anxiety    Blood clotting disorder (HCC)    Diabetes mellitus without complication (HCC)    Migraine    Prediabetes      Family History  Problem Relation Age of Onset   Epilepsy Mother    Hypertension Mother    Healthy Father    Diabetes Maternal Aunt    Cancer Paternal Uncle    Healthy Maternal Grandmother    Dementia Paternal Grandmother    Heart disease Paternal Grandfather    Clotting disorder Other      Current Outpatient Medications:    metFORMIN (GLUCOPHAGE-XR) 500 MG 24 hr tablet, Take 1 tablet (500 mg total) by mouth daily with breakfast., Disp: 30 tablet, Rfl: 8   atorvastatin (LIPITOR) 20 MG tablet, Take 1 tablet (20 mg total) by mouth daily. (Patient not taking: Reported on 06/29/2023), Disp: 30 tablet, Rfl: 11   escitalopram (LEXAPRO) 10 MG tablet, Take 1 tablet (10 mg total) by mouth daily. (Patient not taking: Reported on 06/29/2023), Disp: 30 tablet, Rfl: 3   rivaroxaban (XARELTO) 20 MG TABS tablet, Take 1 tablet (20 mg total) by mouth daily with dinner (Patient not taking: Reported on 06/29/2023), Disp: 30 tablet, Rfl: 5   tirzepatide (ZEPBOUND) 12.5 MG/0.5ML Pen, Inject  12.5 mg into the skin once a week., Disp: 2 mL, Rfl: 1   Vitamin D, Ergocalciferol, (DRISDOL) 1.25 MG (50000 UNIT) CAPS capsule, Take 1 capsule (50,000 Units total) by mouth every 7 (seven) days. (Patient not taking: Reported on 06/29/2023), Disp: 5 capsule, Rfl: 2   No Known Allergies    The patient states she is status post hysterectomy.  Negative for Dysmenorrhea and Negative for Menorrhagia. Negative for: breast discharge, breast lump(s), breast pain and breast self exam. Associated symptoms include abnormal vaginal bleeding. Pertinent negatives include abnormal bleeding (hematology), anxiety, decreased libido, depression, difficulty falling sleep, dyspareunia, history of infertility, nocturia, sexual dysfunction, sleep disturbances, urinary incontinence, urinary urgency, vaginal discharge and vaginal itching. Diet regular; she states "she does not eat too much". 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   The patient's tobacco use is:  Social History   Tobacco Use  Smoking Status Never  Smokeless Tobacco Never  . She has been exposed to passive smoke. The patient's alcohol use is:  Social History   Substance and Sexual Activity  Alcohol Use Yes   Comment: socially   . Additional information: Last pap ***, next one scheduled for ***.    Review of  Systems  Constitutional: Negative.   HENT: Negative.    Eyes: Negative.   Respiratory: Negative.    Cardiovascular: Negative.   Gastrointestinal: Negative.   Endocrine: Negative.   Genitourinary: Negative.   Musculoskeletal: Negative.   Skin: Negative.   Allergic/Immunologic: Negative.   Neurological: Negative.   Hematological: Negative.   Psychiatric/Behavioral: Negative.       Today's Vitals   06/29/23 1532  BP: 120/70  Pulse: 91  Temp: 98.5 F (36.9 C)  TempSrc: Oral  Weight: 292 lb (132.5 kg)  Height: 5\' 5"  (1.651 m)   PainSc: 0-No pain   Body mass index is 48.59 kg/m.  Wt Readings from Last 3 Encounters:  06/29/23 292 lb (132.5 kg)  03/04/23 279 lb (126.6 kg)  02/04/23 282 lb (127.9 kg)     Objective:  Physical Exam Vitals reviewed.  Constitutional:      General: She is not in acute distress.    Appearance: Normal appearance. She is well-developed. She is obese.  HENT:     Head: Normocephalic and atraumatic.     Right Ear: Hearing, tympanic membrane, ear canal and external ear normal. There is no impacted cerumen.     Left Ear: Hearing, tympanic membrane, ear canal and external ear normal. There is no impacted cerumen.     Nose: Nose normal.     Mouth/Throat:     Mouth: Mucous membranes are moist.     Dentition: Has dentures. No dental caries.  Eyes:     General: Lids are normal.     Extraocular Movements: Extraocular movements intact.     Conjunctiva/sclera: Conjunctivae normal.     Pupils: Pupils are equal, round, and reactive to light.     Funduscopic exam:    Right eye: No papilledema.        Left eye: No papilledema.  Neck:     Thyroid: No thyroid mass.     Vascular: No carotid bruit.  Cardiovascular:     Rate and Rhythm: Normal rate and regular rhythm.     Pulses: Normal pulses.     Heart sounds: Normal heart sounds. No murmur heard. Pulmonary:     Effort: Pulmonary effort is normal. No respiratory distress.     Breath sounds: Normal breath sounds. No wheezing.  Chest:     Chest wall: No mass.  Breasts:    Tanner Score is 5.     Right: Normal. No mass or tenderness.     Left: Normal. No mass or tenderness.  Abdominal:     General: Abdomen is flat. Bowel sounds are normal. There is no distension.     Palpations: Abdomen is soft.     Tenderness: There is no abdominal tenderness.  Musculoskeletal:        General: No swelling or tenderness. Normal range of motion.     Cervical back: Full passive range of motion without pain, normal range of motion and neck supple.      Right lower leg: No edema.     Left lower leg: No edema.  Lymphadenopathy:     Upper Body:     Right upper body: No supraclavicular, axillary or pectoral adenopathy.     Left upper body: No supraclavicular, axillary or pectoral adenopathy.  Skin:    General: Skin is warm and dry.     Capillary Refill: Capillary refill takes less than 2 seconds.  Neurological:     General: No focal deficit present.     Mental Status: She is alert  and oriented to person, place, and time.     Cranial Nerves: No cranial nerve deficit.     Sensory: No sensory deficit.     Motor: No weakness.  Psychiatric:        Mood and Affect: Mood normal.        Behavior: Behavior normal.        Thought Content: Thought content normal.        Judgment: Judgment normal.         Assessment And Plan:     Encounter for annual health examination  GAD (generalized anxiety disorder)  Mixed hyperlipidemia -     CMP14+EGFR -     Lipid panel  Prediabetes -     Hemoglobin A1c  Vitamin D deficiency -     VITAMIN D 25 Hydroxy (Vit-D Deficiency, Fractures)  Iron deficiency anemia due to chronic blood loss  Class 3 severe obesity due to excess calories with body mass index (BMI) of 45.0 to 49.9 in adult, unspecified whether serious comorbidity present (HCC) -     Zepbound; Inject 12.5 mg into the skin once a week.  Dispense: 2 mL; Refill: 1  Other long term (current) drug therapy -     CBC with Differential/Platelet     Return for 1 year physical, 6 month chol check. Patient was given opportunity to ask questions. Patient verbalized understanding of the plan and was able to repeat key elements of the plan. All questions were answered to their satisfaction.   Arnette Felts, FNP  I, Arnette Felts, FNP, have reviewed all documentation for this visit. The documentation on 06/29/23 for the exam, diagnosis, procedures, and orders are all accurate and complete.

## 2023-06-29 NOTE — Patient Instructions (Signed)
 Health Maintenance  Topic Date Due   Pap with HPV screening  06/13/2022   Mammogram  07/18/2023   DTaP/Tdap/Td vaccine (4 - Td or Tdap) 02/19/2032   Colon Cancer Screening  04/18/2033   Flu Shot  Completed   Hepatitis C Screening  Completed   HIV Screening  Completed   HPV Vaccine  Aged Out   COVID-19 Vaccine  Discontinued

## 2023-07-02 DIAGNOSIS — Z0000 Encounter for general adult medical examination without abnormal findings: Secondary | ICD-10-CM | POA: Insufficient documentation

## 2023-07-02 NOTE — Assessment & Plan Note (Signed)
 Will check vitamin D level and supplement as needed.    Also encouraged to spend 15 minutes in the sun daily.

## 2023-07-02 NOTE — Assessment & Plan Note (Addendum)
 She is encouraged to strive for BMI less than 30 to decrease cardiac risk. Advised to aim for at least 150 minutes of exercise per week.  She is to continue Zepbound.  Tolerating well.

## 2023-07-02 NOTE — Assessment & Plan Note (Signed)
 Will check hemoglobin A1c.  She is to continue metformin

## 2023-07-02 NOTE — Assessment & Plan Note (Signed)
 Stable

## 2023-07-02 NOTE — Assessment & Plan Note (Signed)
 Cholesterol levels are stable.  Encouraged to continue focusing on low-fat diet.

## 2023-07-02 NOTE — Assessment & Plan Note (Signed)

## 2023-07-19 ENCOUNTER — Inpatient Hospital Stay (HOSPITAL_BASED_OUTPATIENT_CLINIC_OR_DEPARTMENT_OTHER): Admission: RE | Admit: 2023-07-19 | Payer: BC Managed Care – PPO | Source: Ambulatory Visit | Admitting: Radiology

## 2023-07-19 ENCOUNTER — Encounter (HOSPITAL_BASED_OUTPATIENT_CLINIC_OR_DEPARTMENT_OTHER): Payer: Self-pay

## 2023-07-19 DIAGNOSIS — Z1231 Encounter for screening mammogram for malignant neoplasm of breast: Secondary | ICD-10-CM

## 2023-08-31 ENCOUNTER — Ambulatory Visit: Payer: BC Managed Care – PPO | Admitting: Nurse Practitioner

## 2023-11-20 ENCOUNTER — Encounter (HOSPITAL_BASED_OUTPATIENT_CLINIC_OR_DEPARTMENT_OTHER): Payer: Self-pay

## 2023-11-20 ENCOUNTER — Other Ambulatory Visit: Payer: Self-pay

## 2023-11-20 ENCOUNTER — Emergency Department (HOSPITAL_BASED_OUTPATIENT_CLINIC_OR_DEPARTMENT_OTHER)
Admission: EM | Admit: 2023-11-20 | Discharge: 2023-11-20 | Disposition: A | Discharge status: Home / Self Care | Attending: Emergency Medicine | Admitting: Emergency Medicine

## 2023-11-20 ENCOUNTER — Emergency Department (HOSPITAL_BASED_OUTPATIENT_CLINIC_OR_DEPARTMENT_OTHER)

## 2023-11-20 ENCOUNTER — Emergency Department (HOSPITAL_BASED_OUTPATIENT_CLINIC_OR_DEPARTMENT_OTHER): Admitting: Radiology

## 2023-11-20 DIAGNOSIS — R072 Precordial pain: Secondary | ICD-10-CM | POA: Diagnosis not present

## 2023-11-20 DIAGNOSIS — R079 Chest pain, unspecified: Secondary | ICD-10-CM | POA: Diagnosis present

## 2023-11-20 DIAGNOSIS — R030 Elevated blood-pressure reading, without diagnosis of hypertension: Secondary | ICD-10-CM | POA: Insufficient documentation

## 2023-11-20 LAB — BASIC METABOLIC PANEL WITH GFR
Anion gap: 11 (ref 5–15)
BUN: 8 mg/dL (ref 6–20)
CO2: 24 mmol/L (ref 22–32)
Calcium: 11 mg/dL — ABNORMAL HIGH (ref 8.9–10.3)
Chloride: 103 mmol/L (ref 98–111)
Creatinine, Ser: 0.84 mg/dL (ref 0.44–1.00)
GFR, Estimated: >60 mL/min
Glucose, Bld: 83 mg/dL (ref 70–99)
Potassium: 3.8 mmol/L (ref 3.5–5.1)
Sodium: 138 mmol/L (ref 135–145)

## 2023-11-20 LAB — CBC
HCT: 42.4 % (ref 36.0–46.0)
Hemoglobin: 13.6 g/dL (ref 12.0–15.0)
MCH: 28.6 pg (ref 26.0–34.0)
MCHC: 32.1 g/dL (ref 30.0–36.0)
MCV: 89.3 fL (ref 80.0–100.0)
Platelets: 244 10*3/uL (ref 150–400)
RBC: 4.75 MIL/uL (ref 3.87–5.11)
RDW: 13.3 % (ref 11.5–15.5)
WBC: 5.5 10*3/uL (ref 4.0–10.5)
nRBC: 0 % (ref 0.0–0.2)

## 2023-11-20 LAB — TROPONIN T, HIGH SENSITIVITY
Troponin T High Sensitivity: <15 ng/L (ref <19)
Troponin T High Sensitivity: <15 ng/L (ref <19)

## 2023-11-20 MED ORDER — ALUM & MAG HYDROXIDE-SIMETH 200-200-20 MG/5ML PO SUSP
30.0000 mL | Freq: Once | ORAL | Status: AC
  Administered 2023-11-20: 30 mL via ORAL
  Filled 2023-11-20: qty 30

## 2023-11-20 MED ORDER — ACETAMINOPHEN 500 MG PO TABS
1000.0000 mg | ORAL_TABLET | Freq: Once | ORAL | Status: AC
  Administered 2023-11-20: 1000 mg via ORAL
  Filled 2023-11-20: qty 2

## 2023-11-20 MED ORDER — FAMOTIDINE 20 MG PO TABS
20.0000 mg | ORAL_TABLET | Freq: Once | ORAL | Status: AC
  Administered 2023-11-20: 20 mg via ORAL
  Filled 2023-11-20: qty 1

## 2023-11-20 NOTE — ED Provider Notes (Signed)
 [place] EMERGENCY DEPARTMENT AT [facility] Provider Note   CSN: [id number] Arrival date & time: 11/20/23  1745     Patient presents with: Chest Pain   Kristi Frank is a 48 y.o. female.   Patient c/o pain to mid chest area intermittently in past couple weeks. Occurs randomly, mostly at rest, but one occasion with exertion. Lasts minutes to hours, or today all day. No pleuritic pain. No associated diaphoresis, sob or emesis. No unusual doe or fatigue. No personal hx cad/chd. No family hx premature cad, some 'heart trouble' in older age. Denies heartburn/indigestion. No cough or uri symptoms. No fever or chills. No positional change to pain. No leg pain or swelling. No chest wall injury or strain.   The history is provided by the patient and medical records.  Chest Pain Associated symptoms: no abdominal pain, no back pain, no cough, no fever, no headache, no palpitations, no shortness of breath and no vomiting        Prior to Admission medications   Medication Sig Start Date End Date Taking? Authorizing Provider  atorvastatin  (LIPITOR) 20 MG tablet Take 1 tablet (20 mg total) by mouth daily. Patient not taking: Reported on 06/29/2023 12/16/22 12/16/23  Petrina Pries, NP  escitalopram  (LEXAPRO ) 10 MG tablet Take 1 tablet (10 mg total) by mouth daily. Patient not taking: Reported on 06/29/2023 03/04/23 03/03/24  Petrina Pries, NP  metFORMIN  (GLUCOPHAGE -XR) 500 MG 24 hr tablet Take 1 tablet (500 mg total) by mouth daily with breakfast. 12/16/22 09/12/23  Petrina Pries, NP  rivaroxaban  (XARELTO ) 20 MG TABS tablet Take 1 tablet (20 mg total) by mouth daily with dinner Patient not taking: Reported on 06/29/2023 03/04/23   Petrina Pries, NP  tirzepatide  (ZEPBOUND ) 12.5 MG/0.5ML Pen Inject 12.5 mg into the skin once a week. 06/29/23   Georgina Speaks, FNP  Vitamin D , Ergocalciferol , (DRISDOL ) 1.25 MG (50000 UNIT) CAPS capsule Take 1 capsule (50,000 Units total) by mouth every 7 (seven)  days. Patient not taking: Reported on 06/29/2023 03/10/23   Petrina Pries, NP    Allergies: Patient has no known allergies.    Review of Systems  Constitutional:  Negative for chills and fever.  HENT:  Negative for sore throat.   Respiratory:  Negative for cough and shortness of breath.   Cardiovascular:  Positive for chest pain. Negative for palpitations and leg swelling.  Gastrointestinal:  Negative for abdominal pain and vomiting.  Genitourinary:  Negative for flank pain.  Musculoskeletal:  Negative for back pain and neck pain.  Skin:  Negative for rash.  Neurological:  Negative for headaches.    Updated Vital Signs BP (!) 136/107 (BP Location: Right Arm)   Pulse 85   Temp 98 F (36.7 C) (Oral)   Resp 20   Ht 1.651 m (5' 5)   Wt 126.6 kg   SpO2 100%   BMI 46.43 kg/m   Physical Exam Vitals and nursing note reviewed.  Constitutional:      Appearance: Normal appearance. She is well-developed.  HENT:     Head: Atraumatic.     Nose: Nose normal.  Eyes:     General: No scleral icterus.    Conjunctiva/sclera: Conjunctivae normal.  Neck:     Trachea: No tracheal deviation.  Cardiovascular:     Rate and Rhythm: Normal rate and regular rhythm.     Pulses: Normal pulses.     Heart sounds: Normal heart sounds. No murmur heard.    No friction rub. No gallop.  Pulmonary:     Effort: Pulmonary effort is normal. No respiratory distress.     Breath sounds: Normal breath sounds.  Chest:     Chest wall: No tenderness.  Abdominal:     General: Bowel sounds are normal. There is no distension.     Palpations: Abdomen is soft.     Tenderness: There is no abdominal tenderness. There is no guarding.  Musculoskeletal:        General: No swelling or tenderness.     Cervical back: Normal range of motion and neck supple. No rigidity. No muscular tenderness.     Right lower leg: No edema.     Left lower leg: No edema.  Skin:    General: Skin is warm and dry.     Findings: No rash.   Neurological:     Mental Status: She is alert.     Comments: Alert, speech normal.   Psychiatric:        Mood and Affect: Mood normal.     (all labs ordered are listed, but only abnormal results are displayed) Results for orders placed or performed during the hospital encounter of 11/20/23  Basic metabolic panel   Collection Time: 11/20/23  5:58 PM  Result Value Ref Range   Sodium 138 135 - 145 mmol/L   Potassium 3.8 3.5 - 5.1 mmol/L   Chloride 103 98 - 111 mmol/L   CO2 24 22 - 32 mmol/L   Glucose, Bld 83 70 - 99 mg/dL   BUN 8 6 - 20 mg/dL   Creatinine, Ser 9.15 0.44 - 1.00 mg/dL   Calcium  11.0 (H) 8.9 - 10.3 mg/dL   GFR, Estimated >39 >39 mL/min   Anion gap 11 5 - 15  CBC   Collection Time: 11/20/23  5:58 PM  Result Value Ref Range   WBC 5.5 4.0 - 10.5 K/uL   RBC 4.75 3.87 - 5.11 MIL/uL   Hemoglobin 13.6 12.0 - 15.0 g/dL   HCT 57.5 63.9 - 53.9 %   MCV 89.3 80.0 - 100.0 fL   MCH 28.6 26.0 - 34.0 pg   MCHC 32.1 30.0 - 36.0 g/dL   RDW 86.6 88.4 - 84.4 %   Platelets 244 150 - 400 K/uL   nRBC 0.0 0.0 - 0.2 %  Troponin T, High Sensitivity   Collection Time: 11/20/23  5:58 PM  Result Value Ref Range   Troponin T High Sensitivity <15 <19 ng/L  Troponin T, High Sensitivity   Collection Time: 11/20/23  7:39 PM  Result Value Ref Range   Troponin T High Sensitivity <15 <19 ng/L     EKG: EKG Interpretation Date/Time:  Saturday November 20 2023 17:54:26 EDT Ventricular Rate:  79 PR Interval:  170 QRS Duration:  84 QT Interval:  350 QTC Calculation: 401 R Axis:   -12  Text Interpretation: Normal sinus rhythm Non-specific ST-t changes Confirmed by Bernard Drivers ([id number]) on 11/20/2023 6:25:06 PM  Radiology: [facility] Chest Port 1 View Result Date: 11/20/2023 CLINICAL DATA:  Chest pain. EXAM: PORTABLE CHEST 1 VIEW COMPARISON:  02/04/2023 FINDINGS: The heart is mildly enlarged. Stable mediastinal contours. No focal airspace disease, pleural effusion or pneumothorax. On limited  assessment, no acute osseous finding. IMPRESSION: Mild cardiomegaly.  No acute chest findings. Electronically Signed   By: Andrea Gasman M.D.   On: 11/20/2023 19:16     Procedures   Medications Ordered in the ED  acetaminophen  (TYLENOL ) tablet 1,000 mg (1,000 mg Oral Given 11/20/23 1853)  alum &  mag hydroxide-simeth (MAALOX/MYLANTA) 200-200-20 MG/5ML suspension 30 mL (30 mLs Oral Given 11/20/23 1853)  famotidine  (PEPCID ) tablet 20 mg (20 mg Oral Given 11/20/23 1853)                                    Medical Decision Making Problems Addressed: Elevated blood pressure reading: acute illness or injury Precordial chest pain: acute illness or injury with systemic symptoms that poses a threat to life or bodily functions  Amount and/or Complexity of Data Reviewed External Data Reviewed: notes. Labs: ordered. Decision-making details documented in ED Course. Radiology: ordered and independent interpretation performed. Decision-making details documented in ED Course. ECG/medicine tests: ordered and independent interpretation performed. Decision-making details documented in ED Course.  Risk OTC drugs. Decision regarding hospitalization.   Iv ns. Continuous pulse ox and cardiac monitoring. Labs ordered/sent. Imaging ordered.   Differential diagnosis includes ACS, msk cp, gi cp, etc. Dispo decision including potential need for admission considered - will get labs and imaging and reassess.   Reviewed nursing notes and prior charts for additional history. External reports reviewed.   Acetaminophen  po, pepcid  po, maalox po.   Cardiac monitor: sinus rhythm, rate 80.  Labs reviewed/interpreted by me - trop normal. Wbc and hgb normal.   Xrays reviewed/interpreted by me - no pna.   Additional labs reviewed/interpreted by me - delta trop normal, felt not c/w acs.   Rec close pcp/cardiology f/u - referral provided.   Return precautions provided.       Final diagnoses:  Precordial  chest pain  Elevated blood pressure reading    ED Discharge Orders          Ordered    Ambulatory referral to Cardiology       Comments: If you have not heard from the Cardiology office within the next 72 hours please call [phone].   11/20/23 1931               Bernard Drivers, MD 11/20/23 2108

## 2023-11-20 NOTE — ED Triage Notes (Signed)
 Patient arrives POV with complaints of intermittent left side chest pain (squeezing) x3 week. Patient reports becoming dizzy when symptoms occur as well. Rates discomfort a 7/10.

## 2023-11-20 NOTE — Discharge Instructions (Addendum)
 It was our pleasure to provide your ER care today - we hope that you feel better.  For recent chest pain, follow up closely with cardiologist in the next 1-2 weeks - we made referral, and they should be contacting you with an appointment in the next few days.  Also follow up closely regarding your blood pressure that is high today.   Return to ER right away if worse, new symptoms, fevers, recurrent/persistent chest pain, increased trouble breathing, or other concern.

## 2023-12-13 IMAGING — US US RENAL
1 series · 14 of 25 positions shown · non-contrast
Comparison: None.

CLINICAL DATA: Bilateral flank pain

EXAM:
RENAL / URINARY TRACT ULTRASOUND COMPLETE

[Series 1: us renal · 14 of 54 slices shown]
[im 1/54]
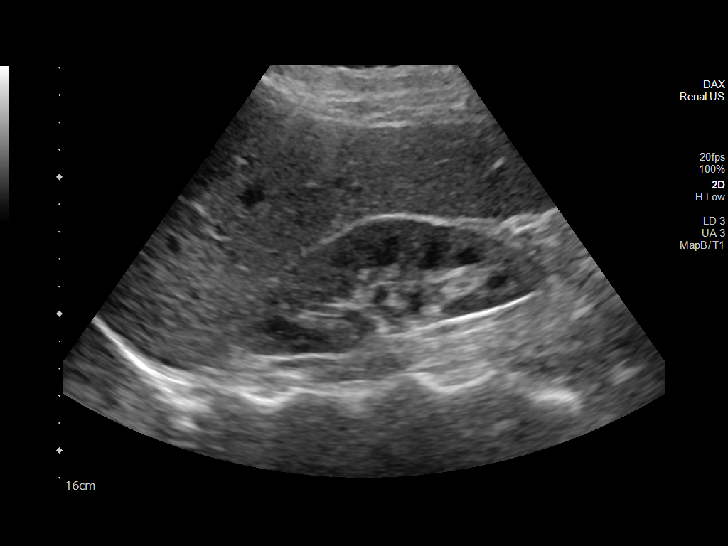
[im 5/54]
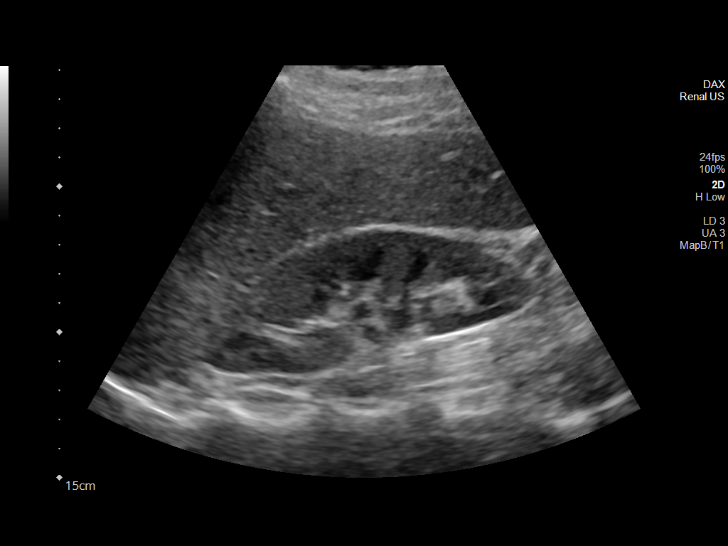
[im 9/54]
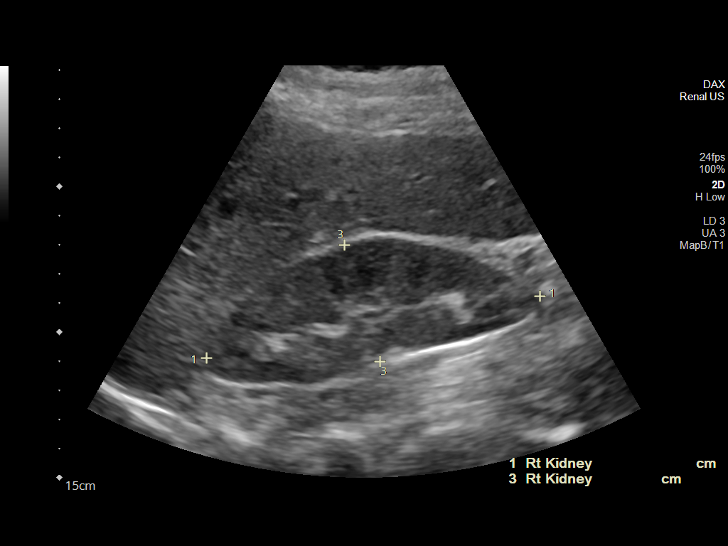
[im 14/54]
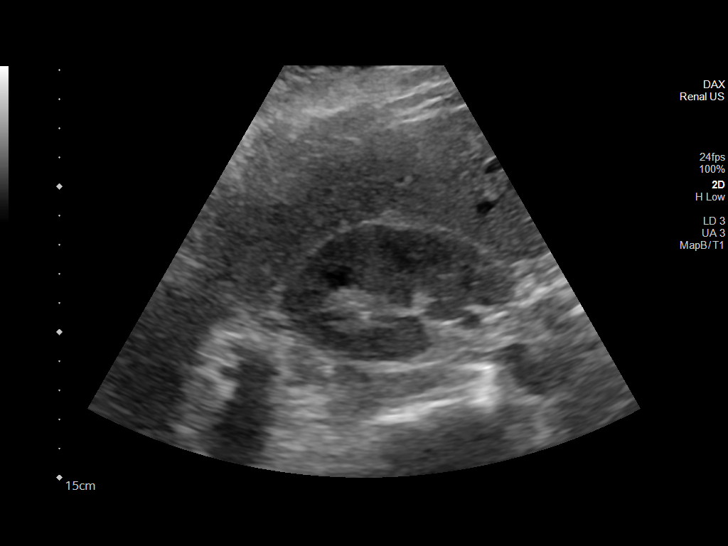
[im 18/54]
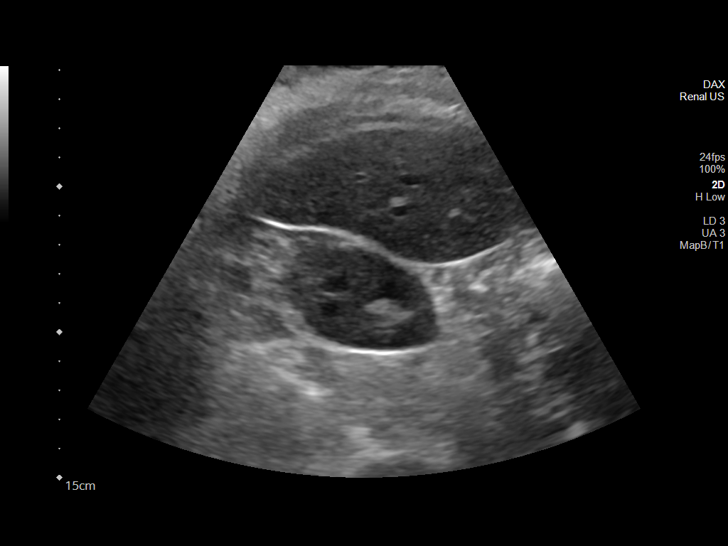
[im 20/54]
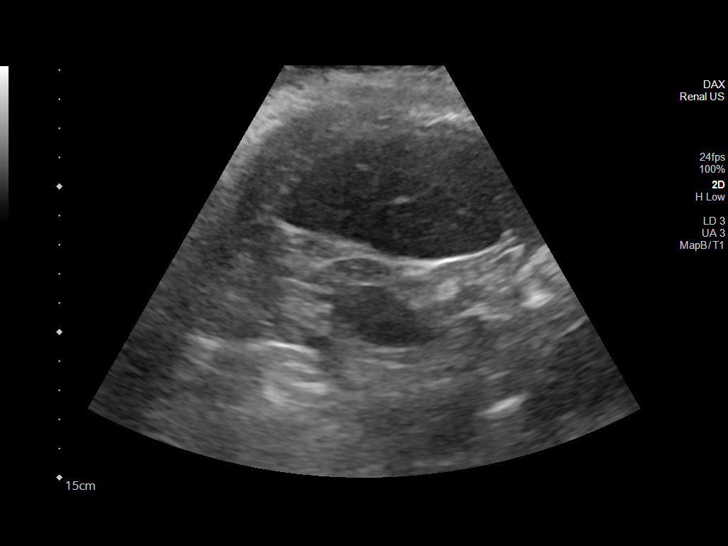
[im 25/54]
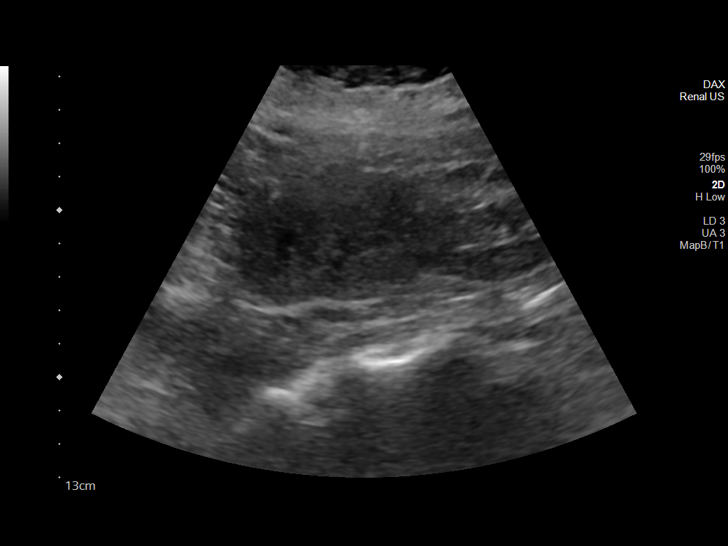
[im 29/54]
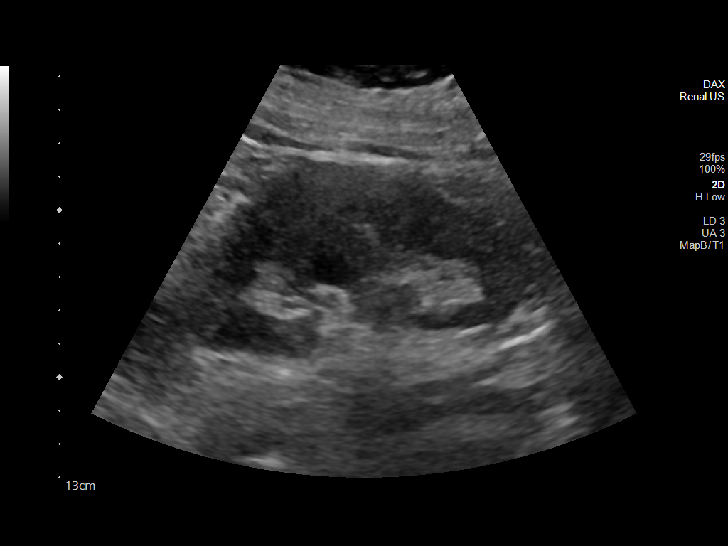
[im 34/54]
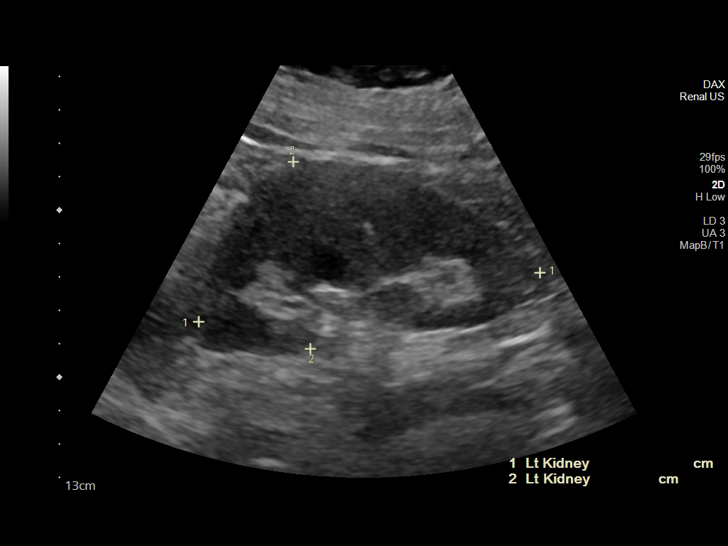
[im 36/54]
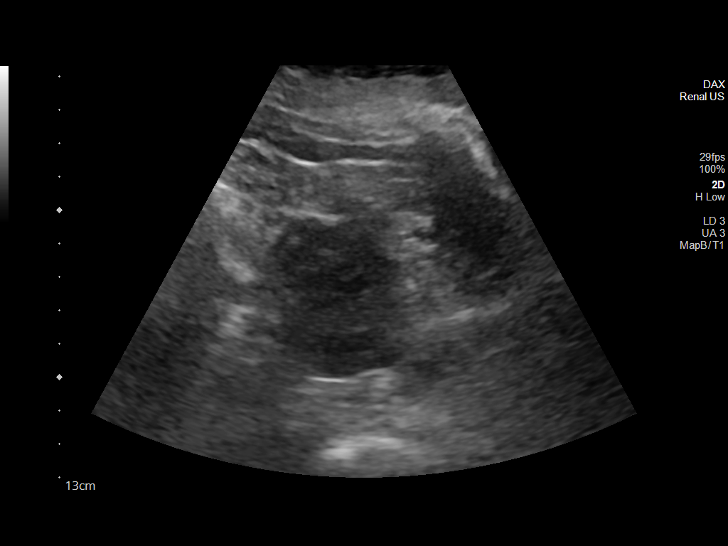
[im 40/54]
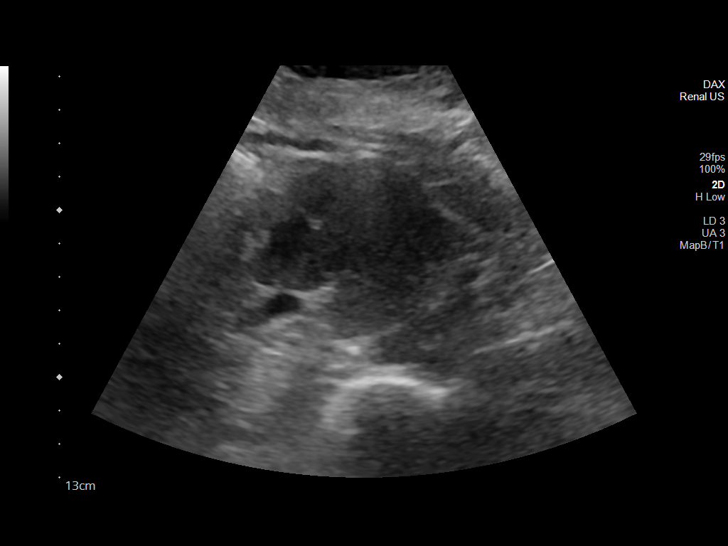
[im 45/54]
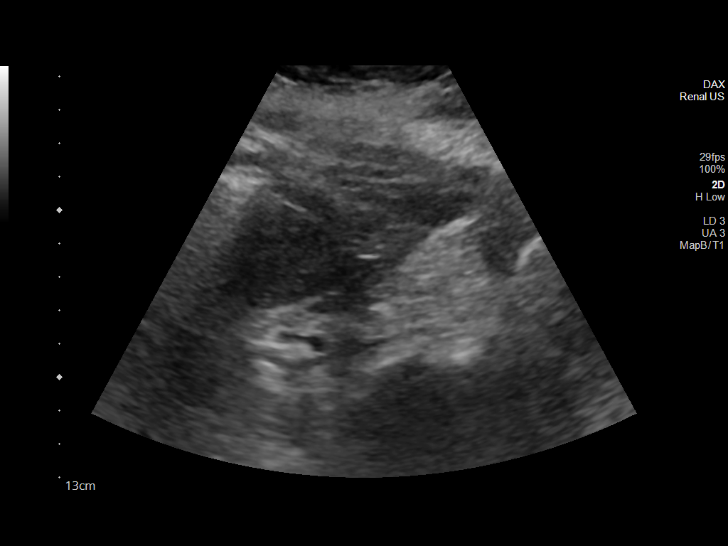
[im 49/54]
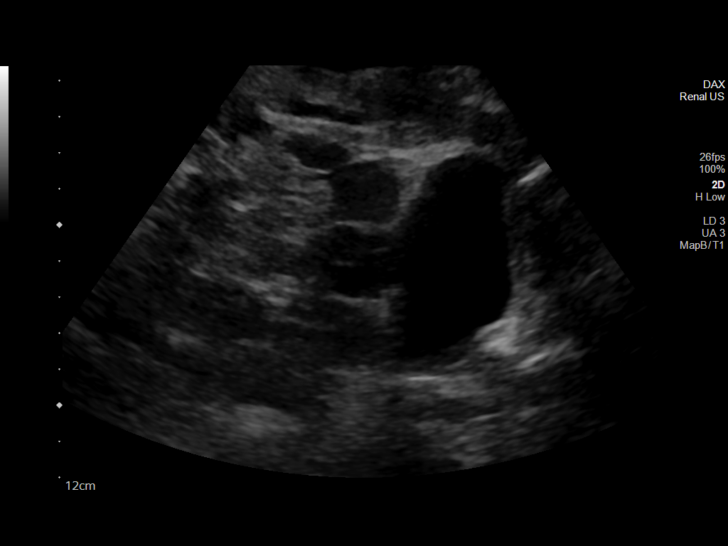
[im 54/54]
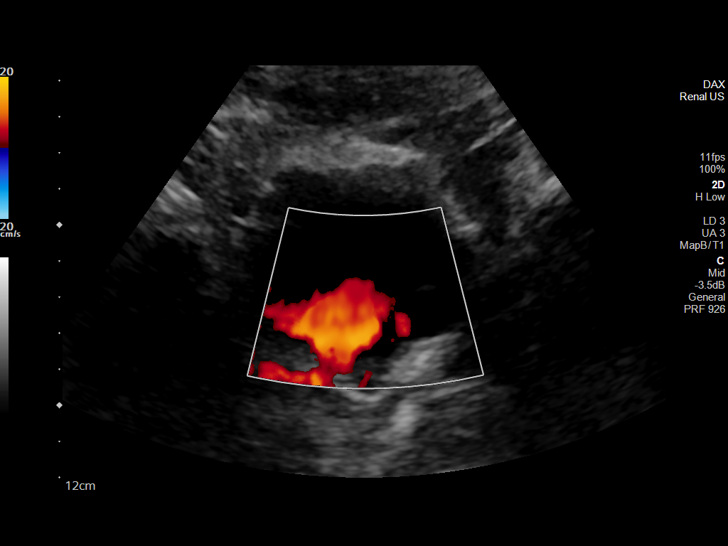

[14 of 25 positions shown; findings below may reference images not displayed]

FINDINGS: Right Kidney:

Renal measurements: 11.7 x 4.2 x 5.5 cm = volume: 139.3 mL.
Echogenicity within normal limits. No mass or hydronephrosis
visualized.

Left Kidney:

Renal measurements: 10.3 x 5.6 x 5.4 cm = volume: 164 mL.
Echogenicity within normal limits. No mass or hydronephrosis
visualized.

Bladder:

Appears normal for degree of bladder distention.

Other:

None.
IMPRESSION: Negative ultrasound.

## 2023-12-28 ENCOUNTER — Ambulatory Visit: Payer: Self-pay | Admitting: Nurse Practitioner

## 2023-12-28 NOTE — Progress Notes (Deleted)
 LILLETTE [name], CMA,acting as a Neurosurgeon for Kristi Ada, FNP.,have documented all relevant documentation on the behalf of Kristi Ada, FNP,as directed by  Kristi Ada, FNP while in the presence of Kristi Ada, FNP.  Subjective:  Patient ID: Kristi Frank , female    DOB: 1975-12-08 , 48 y.o.   MRN: [id number]  No chief complaint on file.   HPI  HPI   Past Medical History:  Diagnosis Date   Anxiety    Blood clotting disorder (HCC)    Diabetes mellitus without complication (HCC)    Migraine    Prediabetes      Family History  Problem Relation Age of Onset   Epilepsy Mother    Hypertension Mother    Healthy Father    Diabetes Maternal Aunt    Cancer Paternal Uncle    Healthy Maternal Grandmother    Dementia Paternal Grandmother    Heart disease Paternal Grandfather    Clotting disorder Other      Current Outpatient Medications:    atorvastatin  (LIPITOR) 20 MG tablet, Take 1 tablet (20 mg total) by mouth daily. (Patient not taking: Reported on 06/29/2023), Disp: 30 tablet, Rfl: 11   escitalopram  (LEXAPRO ) 10 MG tablet, Take 1 tablet (10 mg total) by mouth daily. (Patient not taking: Reported on 06/29/2023), Disp: 30 tablet, Rfl: 3   metFORMIN  (GLUCOPHAGE -XR) 500 MG 24 hr tablet, Take 1 tablet (500 mg total) by mouth daily with breakfast., Disp: 30 tablet, Rfl: 8   rivaroxaban  (XARELTO ) 20 MG TABS tablet, Take 1 tablet (20 mg total) by mouth daily with dinner (Patient not taking: Reported on 06/29/2023), Disp: 30 tablet, Rfl: 5   tirzepatide  (ZEPBOUND ) 12.5 MG/0.5ML Pen, Inject 12.5 mg into the skin once a week., Disp: 2 mL, Rfl: 1   Vitamin D , Ergocalciferol , (DRISDOL ) 1.25 MG (50000 UNIT) CAPS capsule, Take 1 capsule (50,000 Units total) by mouth every 7 (seven) days. (Patient not taking: Reported on 06/29/2023), Disp: 5 capsule, Rfl: 2   No Known Allergies   Review of Systems   There were no vitals filed for this visit. There is no height or weight on file to calculate  BMI.  Wt Readings from Last 3 Encounters:  11/20/23 279 lb (126.6 kg)  06/29/23 292 lb (132.5 kg)  03/04/23 279 lb (126.6 kg)    The 10-year ASCVD risk score (Arnett DK, et al., 2019) is: 0.7%   Values used to calculate the score:     Age: 72 years     Clincally relevant sex: Female     Is Non-Hispanic African American: Yes     Diabetic: No     Tobacco smoker: No     Systolic Blood Pressure: 137 mmHg     Is BP treated: No     HDL Cholesterol: 92 mg/dL     Total Cholesterol: 225 mg/dL  Objective:  Physical Exam      Assessment And Plan:  Mixed hyperlipidemia    No follow-ups on file.  Patient was given opportunity to ask questions. Patient verbalized understanding of the plan and was able to repeat key elements of the plan. All questions were answered to their satisfaction.    LILLETTE Kristi Ada, FNP, have reviewed all documentation for this visit. The documentation on 12/28/23 for the exam, diagnosis, procedures, and orders are all accurate and complete.   IF YOU HAVE BEEN REFERRED TO A SPECIALIST, IT MAY TAKE 1-2 WEEKS TO SCHEDULE/PROCESS THE REFERRAL. IF YOU HAVE NOT HEARD FROM  US /SPECIALIST IN TWO WEEKS, PLEASE GIVE US  A CALL AT [phone].

## 2024-01-26 ENCOUNTER — Encounter: Payer: Self-pay | Admitting: Nurse Practitioner

## 2024-01-26 ENCOUNTER — Ambulatory Visit: Admitting: Nurse Practitioner

## 2024-01-26 VITALS — BP 120/80 | HR 81 | Temp 98.3°F | Ht 65.0 in | Wt 296.8 lb

## 2024-01-26 DIAGNOSIS — Z23 Encounter for immunization: Secondary | ICD-10-CM | POA: Diagnosis not present

## 2024-01-26 DIAGNOSIS — E559 Vitamin D deficiency, unspecified: Secondary | ICD-10-CM

## 2024-01-26 DIAGNOSIS — Z86718 Personal history of other venous thrombosis and embolism: Secondary | ICD-10-CM

## 2024-01-26 DIAGNOSIS — R7303 Prediabetes: Secondary | ICD-10-CM

## 2024-01-26 DIAGNOSIS — Z139 Encounter for screening, unspecified: Secondary | ICD-10-CM

## 2024-01-26 DIAGNOSIS — L290 Pruritus ani: Secondary | ICD-10-CM

## 2024-01-26 DIAGNOSIS — E66813 Obesity, class 3: Secondary | ICD-10-CM

## 2024-01-26 DIAGNOSIS — D500 Iron deficiency anemia secondary to blood loss (chronic): Secondary | ICD-10-CM | POA: Diagnosis not present

## 2024-01-26 DIAGNOSIS — Z86711 Personal history of pulmonary embolism: Secondary | ICD-10-CM

## 2024-01-26 DIAGNOSIS — E782 Mixed hyperlipidemia: Secondary | ICD-10-CM | POA: Diagnosis not present

## 2024-01-26 DIAGNOSIS — Z6841 Body Mass Index (BMI) 40.0 and over, adult: Secondary | ICD-10-CM

## 2024-01-26 DIAGNOSIS — E669 Obesity, unspecified: Secondary | ICD-10-CM | POA: Insufficient documentation

## 2024-01-26 DIAGNOSIS — F411 Generalized anxiety disorder: Secondary | ICD-10-CM

## 2024-01-26 MED ORDER — ATORVASTATIN CALCIUM 20 MG PO TABS: 20.0000 mg | ORAL_TABLET | Freq: Every day | ORAL | 11 refills | Status: DC

## 2024-01-26 MED ORDER — ESCITALOPRAM OXALATE 10 MG PO TABS: 10.0000 mg | ORAL_TABLET | Freq: Every day | ORAL | 3 refills | Status: DC

## 2024-01-26 MED ORDER — VITAMIN D (ERGOCALCIFEROL) 1.25 MG (50000 UNIT) PO CAPS: 50000.0000 [IU] | ORAL_CAPSULE | ORAL | 2 refills | Status: DC

## 2024-01-26 MED ORDER — METFORMIN HCL ER 500 MG PO TB24: 500.0000 mg | ORAL_TABLET | Freq: Every day | ORAL | 1 refills | Status: AC

## 2024-01-26 MED ORDER — PREPARATION H 0.25 % RE SUPP: 1.0000 | Freq: Every day | RECTAL | 1 refills | Status: AC | PRN

## 2024-01-26 MED ORDER — RIVAROXABAN 20 MG PO TABS: 20.0000 mg | ORAL_TABLET | Freq: Every day | ORAL | 5 refills | Status: AC

## 2024-01-26 MED ORDER — ZEPBOUND 5 MG/0.5ML ~~LOC~~ SOAJ: 5.0000 mg | SUBCUTANEOUS | 0 refills | Status: DC

## 2024-01-26 NOTE — Progress Notes (Signed)
 LILLETTE [name], CMA,acting as a Neurosurgeon for [name], FNP.,have documented all relevant documentation on the behalf of [name], FNP,as directed by  [name], FNP while in the presence of [name], FNP.  Subjective:  Patient ID: Kristi Frank , female    DOB: 26-May-1975 , 48 y.o.   MRN: [id number]  Chief Complaint  Patient presents with   Hyperlipidemia    Patient presents today for a chol and pre dm follow up, Patient reports compliance with medication. Patient denies any chest pain, SOB, or headaches.    Obesity    Patient hasn't had zepbound  in weeks patient hasn't had weight check since February and hasn't been able to get medication.     HPI  Discussed the use of AI scribe software for clinical note transcription with the patient, who gave verbal consent to proceed.  History of Present Illness Orlean H Plamondon is a 49 year old female who presents with anxiety and medication management issues.  She has been experiencing significant anxiety since late February or early March, coinciding with her father's cancer diagnosis. Episodes of chest pain radiating across her chest and causing difficulty breathing initially occurred during a trip to the airport and have since become a daily occurrence. She visited the emergency department for evaluation of her symptoms and was told there was nothing wrong with her heart; she was informed it was probably just anxiety.  She was prescribed medication for anxiety by a previous provider, which she began taking about a month ago. She notes a significant improvement in her symptoms, feeling calmer and less reactive to stressors. However, she has run out of her medication and encountered issues with refilling it due to prescription limits.  She has been traveling frequently to [place] to support her father, who was diagnosed with esophageal cancer. This has been a challenging period, with commuting every other week to be with him. Her  father initially showed improvement, but recent developments have been distressing, including his struggle with substance use following a family tragedy.  She reports inconsistent eating patterns, often not feeling hungry and consuming minimal food on some days, while on other days she eats more regularly. She attributes some of this to her mood and stress levels.  She describes experiencing rectal itching and irritation, which she has previously attempted to treat with antifungal cream with limited success. She wonders if hemorrhoids could be the cause, as she sometimes feels like there is something present internally.   Past Medical History:  Diagnosis Date   Anemia    Anxiety    Blood clotting disorder    Blood transfusion without reported diagnosis    Diabetes mellitus without complication (HCC)    Heart murmur    Migraine    Myocardial infarction (HCC)    Prediabetes      Family History  Problem Relation Age of Onset   Epilepsy Mother    Hypertension Mother    Obesity Mother    Healthy Father    Diabetes Maternal Aunt    Cancer Paternal Uncle    Healthy Maternal Grandmother    Dementia Paternal Grandmother    Heart disease Paternal Grandfather    Clotting disorder Other    Cancer Sister      Current Outpatient Medications:    Phenylephrine HCl (PREPARATION H) 0.25 % SUPP, Place 1 suppository rectally daily as needed., Disp: 12 suppository, Rfl: 1   tirzepatide  (ZEPBOUND ) 5 MG/0.5ML Pen, Inject 5 mg into the skin once  a week., Disp: 2 mL, Rfl: 0   atorvastatin  (LIPITOR) 20 MG tablet, Take 1 tablet (20 mg total) by mouth daily., Disp: 30 tablet, Rfl: 11   escitalopram  (LEXAPRO ) 10 MG tablet, Take 1 tablet (10 mg total) by mouth daily., Disp: 30 tablet, Rfl: 3   metFORMIN  (GLUCOPHAGE -XR) 500 MG 24 hr tablet, Take 1 tablet (500 mg total) by mouth daily with breakfast., Disp: 90 tablet, Rfl: 1   rivaroxaban  (XARELTO ) 20 MG TABS tablet, Take 1 tablet (20 mg total) by mouth  daily with dinner, Disp: 30 tablet, Rfl: 5   Vitamin D , Ergocalciferol , (DRISDOL ) 1.25 MG (50000 UNIT) CAPS capsule, Take 1 capsule (50,000 Units total) by mouth every 7 (seven) days., Disp: 5 capsule, Rfl: 2   No Known Allergies   Review of Systems  Constitutional: Negative.   HENT: Negative.    Eyes: Negative.   Respiratory: Negative.    Cardiovascular: Negative.  Negative for chest pain, palpitations and leg swelling.  Gastrointestinal: Negative.   Endocrine: Negative.   Genitourinary: Negative.   Musculoskeletal: Negative.        Right flank pain with movement  Skin: Negative.   Allergic/Immunologic: Negative.   Neurological: Negative.   Hematological: Negative.   Psychiatric/Behavioral: Negative.       Today's Vitals   01/26/24 1540  BP: 120/80  Pulse: 81  Temp: 98.3 F (36.8 C)  TempSrc: Oral  Weight: 296 lb 12.8 oz (134.6 kg)  Height: 5' 5 (1.651 m)  PainSc: 0-No pain   Body mass index is 49.39 kg/m.  Wt Readings from Last 3 Encounters:  01/26/24 296 lb 12.8 oz (134.6 kg)  11/20/23 279 lb (126.6 kg)  06/29/23 292 lb (132.5 kg)     Objective:  Physical Exam Vitals reviewed.  Constitutional:      Appearance: She is well-developed.  HENT:     Head: Normocephalic and atraumatic.  Eyes:     Pupils: Pupils are equal, round, and reactive to light.  Cardiovascular:     Rate and Rhythm: Normal rate and regular rhythm.     Pulses: Normal pulses.     Heart sounds: Normal heart sounds. No murmur heard. Pulmonary:     Effort: Pulmonary effort is normal. No respiratory distress.     Breath sounds: Normal breath sounds.  Skin:    General: Skin is warm and dry.     Capillary Refill: Capillary refill takes less than 2 seconds.  Neurological:     General: No focal deficit present.     Mental Status: She is alert and oriented to person, place, and time.     Cranial Nerves: No cranial nerve deficit.  Psychiatric:        Mood and Affect: Mood normal.         Assessment And Plan:  Mixed hyperlipidemia Assessment & Plan: Cholesterol levels are stable.  Encouraged to continue focusing on low-fat diet.  Orders: -     Lipid panel -     Atorvastatin  Calcium ; Take 1 tablet (20 mg total) by mouth daily.  Dispense: 30 tablet; Refill: 11  Prediabetes Assessment & Plan: Will check hemoglobin A1c.  She is to continue metformin   Orders: -     Hemoglobin A1c -     metFORMIN  HCl ER; Take 1 tablet (500 mg total) by mouth daily with breakfast.  Dispense: 90 tablet; Refill: 1  Need for influenza vaccination Assessment & Plan: Influenza vaccine administered Encouraged to take Tylenol  as needed for fever or muscle  aches.   Orders: -     Flu vaccine trivalent PF, 6mos and older(Flulaval,Afluria,Fluarix,Fluzone)  Class 3 severe obesity due to excess calories with body mass index (BMI) of 45.0 to 49.9 in adult, unspecified whether serious comorbidity present Assessment & Plan: Off Tirzepatide  since August, facing weight management challenges, especially during travel and stress. Discussed weight loss surgery and compounded medications, advised of complications and side effects. Emphasized lifestyle changes with medication. - Restart Tirzepatide  at 5 mg due to prolonged discontinuation and potential for nausea. - Discussed importance of balanced diet, regular physical activity, and stress management. - Advised against compounded weight loss medications due to potential side effects. - Encouraged lifestyle changes to support weight management.  Orders: -     Zepbound ; Inject 5 mg into the skin once a week.  Dispense: 2 mL; Refill: 0  Encounter for screening -     Hepatitis B surface antibody,qualitative  Vitamin D  deficiency Assessment & Plan: Will check vitamin D  level and supplement as needed.    Also encouraged to spend 15 minutes in the sun daily.    Orders: -     VITAMIN D  25 Hydroxy (Vit-D Deficiency, Fractures) -     Vitamin D   (Ergocalciferol ); Take 1 capsule (50,000 Units total) by mouth every 7 (seven) days.  Dispense: 5 capsule; Refill: 2  Iron deficiency anemia due to chronic blood loss  History of blood clots  GAD (generalized anxiety disorder) Assessment & Plan: Increased anxiety and panic attacks related to family stressors. Current medication improving symptoms. - Encouraged stress management techniques and regular follow-up for anxiety management.  Orders: -     Escitalopram  Oxalate; Take 1 tablet (10 mg total) by mouth daily.  Dispense: 30 tablet; Refill: 3  History of pulmonary embolism -     Rivaroxaban ; Take 1 tablet (20 mg total) by mouth daily with dinner  Dispense: 30 tablet; Refill: 5  Rectal itching Assessment & Plan: Intermittent rectal itching and irritation, possibly related to hemorrhoids. Previous antifungal treatment provided minimal relief. - Prescribe Preparation H suppositories for symptomatic relief. - Consider GI referral if symptoms persist.   Other orders -     Preparation H; Place 1 suppository rectally daily as needed.  Dispense: 12 suppository; Refill: 1     Return for 2 months weight check.  Patient was given opportunity to ask questions. Patient verbalized understanding of the plan and was able to repeat key elements of the plan. All questions were answered to their satisfaction.    LILLETTE [name], FNP, have reviewed all documentation for this visit. The documentation on 09/42/25 for the exam, diagnosis, procedures, and orders are all accurate and complete.   IF YOU HAVE BEEN REFERRED TO A SPECIALIST, IT MAY TAKE 1-2 WEEKS TO SCHEDULE/PROCESS THE REFERRAL. IF YOU HAVE NOT HEARD FROM US /SPECIALIST IN TWO WEEKS, PLEASE GIVE US  A CALL AT [phone].

## 2024-01-27 LAB — HEMOGLOBIN A1C
Est. average glucose Bld gHb Est-mCnc: 120 mg/dL
Hgb A1c MFr Bld: 5.8 % — ABNORMAL HIGH (ref 4.8–5.6)

## 2024-01-27 LAB — HEPATITIS B SURFACE ANTIBODY,QUALITATIVE: Hep B Surface Ab, Qual: NONREACTIVE

## 2024-01-27 LAB — LIPID PANEL
Chol/HDL Ratio: 2.8 ratio (ref 0.0–4.4)
Cholesterol, Total: 211 mg/dL — ABNORMAL HIGH (ref 100–199)
HDL: 75 mg/dL (ref >39)
LDL Chol Calc (NIH): 114 mg/dL — ABNORMAL HIGH (ref 0–99)
Triglycerides: 129 mg/dL (ref 0–149)
VLDL Cholesterol Cal: 22 mg/dL (ref 5–40)

## 2024-01-27 LAB — VITAMIN D 25 HYDROXY (VIT D DEFICIENCY, FRACTURES): Vit D, 25-Hydroxy: 13.7 ng/mL — ABNORMAL LOW (ref 30.0–100.0)

## 2024-02-02 ENCOUNTER — Ambulatory Visit: Payer: Self-pay | Admitting: Nurse Practitioner

## 2024-02-02 DIAGNOSIS — E559 Vitamin D deficiency, unspecified: Secondary | ICD-10-CM

## 2024-02-02 DIAGNOSIS — L290 Pruritus ani: Secondary | ICD-10-CM | POA: Insufficient documentation

## 2024-02-02 MED ORDER — VITAMIN D (ERGOCALCIFEROL) 1.25 MG (50000 UNIT) PO CAPS: 50000.0000 [IU] | ORAL_CAPSULE | ORAL | 1 refills | Status: AC

## 2024-02-02 NOTE — Assessment & Plan Note (Signed)
 Off Tirzepatide  since August, facing weight management challenges, especially during travel and stress. Discussed weight loss surgery and compounded medications, advised of complications and side effects. Emphasized lifestyle changes with medication. - Restart Tirzepatide  at 5 mg due to prolonged discontinuation and potential for nausea. - Discussed importance of balanced diet, regular physical activity, and stress management. - Advised against compounded weight loss medications due to potential side effects. - Encouraged lifestyle changes to support weight management.

## 2024-02-02 NOTE — Assessment & Plan Note (Signed)
 Intermittent rectal itching and irritation, possibly related to hemorrhoids. Previous antifungal treatment provided minimal relief. - Prescribe Preparation H suppositories for symptomatic relief. - Consider GI referral if symptoms persist.

## 2024-02-02 NOTE — Assessment & Plan Note (Signed)
 Will check vitamin D  level and supplement as needed.    Also encouraged to spend 15 minutes in the sun daily.

## 2024-02-02 NOTE — Assessment & Plan Note (Signed)
 Influenza vaccine administered Encouraged to take Tylenol as needed for fever or muscle aches.

## 2024-02-02 NOTE — Assessment & Plan Note (Signed)
 Increased anxiety and panic attacks related to family stressors. Current medication improving symptoms. - Encouraged stress management techniques and regular follow-up for anxiety management.

## 2024-02-02 NOTE — Assessment & Plan Note (Signed)
 Cholesterol levels are stable.  Encouraged to continue focusing on low-fat diet.

## 2024-02-02 NOTE — Assessment & Plan Note (Signed)
 Will check hemoglobin A1c.  She is to continue metformin

## 2024-02-03 ENCOUNTER — Telehealth: Payer: Self-pay

## 2024-02-03 NOTE — Telephone Encounter (Signed)
 Pa for zepbound  sent to plan.

## 2024-02-14 ENCOUNTER — Other Ambulatory Visit (HOSPITAL_BASED_OUTPATIENT_CLINIC_OR_DEPARTMENT_OTHER): Payer: Self-pay | Admitting: Nurse Practitioner

## 2024-02-14 DIAGNOSIS — Z1231 Encounter for screening mammogram for malignant neoplasm of breast: Secondary | ICD-10-CM

## 2024-02-15 ENCOUNTER — Inpatient Hospital Stay (HOSPITAL_BASED_OUTPATIENT_CLINIC_OR_DEPARTMENT_OTHER): Admission: RE | Admit: 2024-02-15 | Source: Ambulatory Visit | Admitting: Radiology

## 2024-02-15 DIAGNOSIS — Z1231 Encounter for screening mammogram for malignant neoplasm of breast: Secondary | ICD-10-CM

## 2024-03-03 ENCOUNTER — Telehealth: Payer: Self-pay

## 2024-03-03 NOTE — Telephone Encounter (Signed)
 PA for zepbound  states   An active PA is already on file with expiration date of 09/30/2024. Please wait to resubmit request within 60 days of that expiration date to obtain a PA renewal.

## 2024-03-07 ENCOUNTER — Other Ambulatory Visit: Payer: Self-pay

## 2024-03-07 MED ORDER — TIRZEPATIDE-WEIGHT MANAGEMENT 10 MG/0.5ML ~~LOC~~ SOAJ: 10.0000 mg | SUBCUTANEOUS | 1 refills | Status: DC

## 2024-03-26 NOTE — Progress Notes (Unsigned)
 Cardiology Office Note:  .   Date:  03/27/2024  ID:  CLOMA RAHRIG, DOB 1975-06-08, MRN [id number] PCP: Georgina Speaks, FNP  [facility] Providers Cardiologist:  None   History of Present Illness: .    Chief Complaint  Patient presents with   Chest Pain    Jailee H Letendre is a 48 y.o. female with history of obesity, PE who presents for the evaluation of chest pain at the request of Georgina Speaks, FNP. Seen in July in ER for CP. Negative work-up.    History of Present Illness   Nafeesah H Hightower is a 48 year old female with a history of obesity and pulmonary embolism who presents with chest pain.  She first experienced chest pain approximately 12 to 15 years ago, initially described as a 'real, real tight feeling' that resolved quickly. In July, she had a more severe episode described as 'sharp, like somebody was hitting me in the chest with a sledgehammer,' which occurred while she was stressed about missing a flight. This episode led to an ER visit where cardiac workup, including EKG and troponin, was negative. She has not experienced any further episodes since July.  She has a history of pulmonary embolism, with the first occurrence around April 20, 2009 and the last episode approximately 9 to 10 years ago. She is currently on a blood thinner.  She has been told she is prediabetic for years but has not been diagnosed with diabetes. She has experienced elevated blood pressure readings recently, although she has not had a history of hypertension.  Her family history is significant for heart disease on her father's side, including an uncle who died of a massive heart attack in 20-Apr-2020 and a grandfather who died of heart disease when she was young.  Socially, she does not smoke and drinks alcohol socially. She works as a designer, multimedia, which involves a significant amount of physical activity recently due to increased workload. She is single with three children.  No recent chest  discomfort or history of heart attack or stroke.          Problem List PE Obesity -BMI 47    ROS: All other ROS reviewed and negative. Pertinent positives noted in the HPI.     Studies Reviewed: SABRA       EKG 11/20/2023 NSR 79 bpm, no acute changes  Physical Exam:   VS:  BP (!) 130/90   Pulse 88   Ht 5' 5 (1.651 m)   Wt 283 lb 3.2 oz (128.5 kg)   SpO2 98%   BMI 47.13 kg/m    Wt Readings from Last 3 Encounters:  03/27/24 283 lb 3.2 oz (128.5 kg)  01/26/24 296 lb 12.8 oz (134.6 kg)  11/20/23 279 lb (126.6 kg)    GEN: Well nourished, well developed in no acute distress NECK: No JVD; No carotid bruits CARDIAC: RRR, no murmurs, rubs, gallops RESPIRATORY:  Clear to auscultation without rales, wheezing or rhonchi  ABDOMEN: Soft, non-tender, non-distended EXTREMITIES:  No edema; No deformity  ASSESSMENT AND PLAN: .   Assessment and Plan    Chest Pain, non-cardiac - Suspect stress-related. CT PE study 04-21-2023 without coronary calcium . No further symptoms since July. We have opted to defer further testing. She will see us  back as needed. Clear stress component to her symptoms.    Obesity BMI of 47. Engaged in lifestyle modifications with family support. - Continue lifestyle modifications including diet and exercise.  Elevated blood pressure Recent elevated readings,  possibly stress-related. No hypertension history.              Follow-up: Return if symptoms worsen or fail to improve.  Signed, Darryle DASEN. Barbaraann, MD, [facility]  [facility]  [address] [place], [place] [address] [phone]  4:05 PM

## 2024-03-27 ENCOUNTER — Ambulatory Visit (INDEPENDENT_AMBULATORY_CARE_PROVIDER_SITE_OTHER): Admitting: Cardiovascular Disease

## 2024-03-27 ENCOUNTER — Ambulatory Visit: Payer: Self-pay | Admitting: Nurse Practitioner

## 2024-03-27 ENCOUNTER — Encounter (HOSPITAL_BASED_OUTPATIENT_CLINIC_OR_DEPARTMENT_OTHER): Payer: Self-pay | Admitting: Cardiovascular Disease

## 2024-03-27 VITALS — BP 130/90 | HR 88 | Ht 65.0 in | Wt 283.2 lb

## 2024-03-27 DIAGNOSIS — R072 Precordial pain: Secondary | ICD-10-CM

## 2024-03-27 NOTE — Patient Instructions (Signed)
 Medication Instructions:  ?Your physician recommends that you continue on your current medications as directed. Please refer to the Current Medication list given to you today.  ? ?Labwork: ?NONE ? ?Testing/Procedures: ?NONE ? ?Follow-Up: ?AS NEEDED  ? ?  ?

## 2024-04-10 ENCOUNTER — Other Ambulatory Visit: Payer: Self-pay

## 2024-04-10 ENCOUNTER — Encounter: Payer: Self-pay | Admitting: Nurse Practitioner

## 2024-04-10 MED ORDER — ZEPBOUND 12.5 MG/0.5ML ~~LOC~~ SOAJ: 12.5000 mg | SUBCUTANEOUS | 1 refills | Status: AC

## 2024-04-20 ENCOUNTER — Ambulatory Visit: Payer: Self-pay | Admitting: Nurse Practitioner

## 2024-04-20 VITALS — BP 130/80 | HR 80 | Temp 98.5°F | Ht 65.0 in | Wt 283.0 lb

## 2024-04-20 DIAGNOSIS — E66813 Obesity, class 3: Secondary | ICD-10-CM

## 2024-04-20 DIAGNOSIS — E782 Mixed hyperlipidemia: Secondary | ICD-10-CM | POA: Diagnosis not present

## 2024-04-20 DIAGNOSIS — Z6841 Body Mass Index (BMI) 40.0 and over, adult: Secondary | ICD-10-CM | POA: Diagnosis not present

## 2024-04-20 DIAGNOSIS — E559 Vitamin D deficiency, unspecified: Secondary | ICD-10-CM

## 2024-04-20 DIAGNOSIS — F411 Generalized anxiety disorder: Secondary | ICD-10-CM | POA: Diagnosis not present

## 2024-04-20 MED ORDER — ESCITALOPRAM OXALATE 10 MG PO TABS: 10.0000 mg | ORAL_TABLET | Freq: Every day | ORAL | 1 refills | Status: AC

## 2024-04-20 MED ORDER — ATORVASTATIN CALCIUM 20 MG PO TABS: 20.0000 mg | ORAL_TABLET | Freq: Every day | ORAL | 1 refills | Status: AC

## 2024-04-20 NOTE — Progress Notes (Signed)
 LILLETTE [name], CMA,acting as a neurosurgeon for [name], FNP.,have documented all relevant documentation on the behalf of [name], FNP,as directed by  [name], FNP while in the presence of [name], FNP.  Subjective:  Patient ID: Kristi Frank , female    DOB: 14-Apr-1976 , 48 y.o.   MRN: [id number]  Chief Complaint  Patient presents with   Obesity    Patient presents today for a weight follow up, Patient reports compliance with medication. Patient denies any chest pain, SOB, or headaches. Patient has no concerns today.      She is taking zepbound  12.5mg  weekly.  After her appt in September - she was working for 2 people due to low census. She was walking more. She has been purposely walking more. She feels like she has more energy.  She is drinking at least 64 oz water a day. She has tried to make it a point to get breakfast. Today she had chicken salad. Yesterday she ate [employer] sandwich took off half the bread. She did have a small soda on Tuesday, has not drank much.     Discussed the use of AI scribe software for clinical note transcription with the patient, who gave verbal consent to proceed.  History of Present Illness Kristi Frank is a 48 year old female who presents for a weight check.  She has been taking Zepbound , with a recent increase in dosage to 12.5 mg, resulting in a weight loss of 13 pounds between September and November. She attributes this weight loss to increased physical activity at work and improved eating habits.  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 She has been trying to maintain this level of activity even after her workload decreased. She is motivated to continue losing weight and feels more energetic.  Her dietary habits have changed, with an emphasis on healthier eating. She has been trying to eat breakfast, usually a  protein bar and a hard-boiled egg, and has been avoiding large meals. She drinks coffee with a little creamer and has significantly reduced her soda intake. She is mindful of her food choices, avoiding rice and corn, and focusing on vegetables and lean proteins.  She experienced two episodes of anxiety or panic attacks in the past week, characterized by chest pressure and a feeling of chaos. These episodes occurred once at work and once at home. She has not been taking her Lexapro  regularly due to not picking up her prescription, which may have contributed to these episodes.  She is currently taking atorvastatin , metformin , and vitamin D . She has not been taking her Lexapro  regularly due to not picking up her prescription.  Past Medical History:  Diagnosis Date   Anemia    Anxiety    Blood clotting disorder    Blood transfusion without reported diagnosis    Diabetes mellitus without complication (HCC)    Heart murmur    Migraine    Myocardial infarction (HCC)    Prediabetes      Family History  Problem Relation Age of Onset   Epilepsy Mother    Hypertension Mother    Obesity Mother    Healthy Father    Diabetes Maternal Aunt    Cancer Paternal Uncle    Healthy Maternal Grandmother    Dementia Paternal Grandmother    Heart disease Paternal Grandfather    Clotting disorder Other    Cancer  Sister     Current Medications[1]   Allergies[2]   Review of Systems  Constitutional: Negative.   HENT: Negative.    Eyes: Negative.   Respiratory: Negative.    Cardiovascular: Negative.  Negative for chest pain, palpitations and leg swelling.  Gastrointestinal: Negative.   Endocrine: Negative.   Genitourinary: Negative.   Musculoskeletal: Negative.        Right flank pain with movement  Skin: Negative.   Allergic/Immunologic: Negative.   Neurological: Negative.   Hematological: Negative.   Psychiatric/Behavioral: Negative.       Today's Vitals   04/20/24 1558  BP: 130/80   Pulse: 80  Temp: 98.5 F (36.9 C)  TempSrc: Oral  Weight: 283 lb (128.4 kg)  Height: 5' 5 (1.651 m)  PainSc: 0-No pain   Body mass index is 47.09 kg/m.  Wt Readings from Last 3 Encounters:  04/20/24 283 lb (128.4 kg)  03/27/24 283 lb 3.2 oz (128.5 kg)  01/26/24 296 lb 12.8 oz (134.6 kg)      Objective:  Physical Exam Vitals and nursing note reviewed.  Constitutional:      Appearance: She is well-developed.  HENT:     Head: Normocephalic and atraumatic.  Eyes:     Pupils: Pupils are equal, round, and reactive to light.  Cardiovascular:     Rate and Rhythm: Normal rate and regular rhythm.     Pulses: Normal pulses.     Heart sounds: Normal heart sounds. No murmur heard. Pulmonary:     Effort: Pulmonary effort is normal. No respiratory distress.     Breath sounds: Normal breath sounds.  Skin:    General: Skin is warm and dry.     Capillary Refill: Capillary refill takes less than 2 seconds.  Neurological:     General: No focal deficit present.     Mental Status: She is alert and oriented to person, place, and time.     Cranial Nerves: No cranial nerve deficit.  Psychiatric:        Mood and Affect: Mood normal.         Assessment And Plan:   Assessment & Plan Class 3 severe obesity due to excess calories with body mass index (BMI) of 45.0 to 49.9 in adult, unspecified whether serious comorbidity present (HCC) She lost 13 pounds with Zepbound  12.5 mg, remains active, and is motivated to continue weight loss with dietary changes and increased physical activity. - Continue Zepbound  at 12.5 mg. - Encourage regular physical activity and gym attendance. - Advise on maintaining a balanced diet with portion control. - Encourage drinking at least 64 ounces of water daily. Follow-up scheduled to assess progress on weight loss and health parameters. - Schedule follow-up visit in February. - Set a weight loss goal of 8-10 pounds by the next visit. GAD (generalized anxiety  disorder) Two anxiety episodes occurred due to a lapse in Lexapro . Family support noted. - Resume Lexapro  as prescribed. - Ensure prescription is picked up and taken regularly. - Monitor for improvement in anxiety symptoms. Mixed hyperlipidemia On atorvastatin  with cholesterol levels to be re-evaluated next visit. - Continue atorvastatin  as prescribed. - Recheck cholesterol and labs at the next visit. - Advise fasting for at least 6 hours before lab tests. Vitamin D  deficiency Due for a refill of vitamin D  supplements. - Ensure vitamin D  prescription is refilled and taken as prescribed.  No orders of the defined types were placed in this encounter.    Return for keep same next.  Patient was given opportunity to ask questions. Patient verbalized understanding of the plan and was able to repeat key elements of the plan. All questions were answered to their satisfaction.   LILLETTE [name], FNP, have reviewed all documentation for this visit. The documentation on 04/20/2024 for the exam, diagnosis, procedures, and orders are all accurate and complete.   IF YOU HAVE BEEN REFERRED TO A SPECIALIST, IT MAY TAKE 1-2 WEEKS TO SCHEDULE/PROCESS THE REFERRAL. IF YOU HAVE NOT HEARD FROM US /SPECIALIST IN TWO WEEKS, PLEASE GIVE US  A CALL AT [phone].      [1]  Current Outpatient Medications:    metFORMIN  (GLUCOPHAGE -XR) 500 MG 24 hr tablet, Take 1 tablet (500 mg total) by mouth daily with breakfast., Disp: 90 tablet, Rfl: 1   Phenylephrine HCl (PREPARATION H) 0.25 % SUPP, Place 1 suppository rectally daily as needed., Disp: 12 suppository, Rfl: 1   rivaroxaban  (XARELTO ) 20 MG TABS tablet, Take 1 tablet (20 mg total) by mouth daily with dinner, Disp: 30 tablet, Rfl: 5   tirzepatide  (ZEPBOUND ) 12.5 MG/0.5ML Pen, Inject 12.5 mg into the skin once a week., Disp: 2 mL, Rfl: 1   Vitamin D , Ergocalciferol , (DRISDOL ) 1.25 MG (50000 UNIT) CAPS capsule, Take 1 capsule (50,000 Units total) by mouth 2  (two) times a week., Disp: 24 capsule, Rfl: 1   atorvastatin  (LIPITOR) 20 MG tablet, Take 1 tablet (20 mg total) by mouth daily., Disp: 90 tablet, Rfl: 1   escitalopram  (LEXAPRO ) 10 MG tablet, Take 1 tablet (10 mg total) by mouth daily., Disp: 90 tablet, Rfl: 1 [2] No Known Allergies

## 2024-04-30 NOTE — Assessment & Plan Note (Signed)
 She lost 13 pounds with Zepbound  12.5 mg, remains active, and is motivated to continue weight loss with dietary changes and increased physical activity. - Continue Zepbound  at 12.5 mg. - Encourage regular physical activity and gym attendance. - Advise on maintaining a balanced diet with portion control. - Encourage drinking at least 64 ounces of water daily. Follow-up scheduled to assess progress on weight loss and health parameters. - Schedule follow-up visit in February. - Set a weight loss goal of 8-10 pounds by the next visit.

## 2024-04-30 NOTE — Assessment & Plan Note (Signed)
 Due for a refill of vitamin D  supplements. - Ensure vitamin D  prescription is refilled and taken as prescribed.

## 2024-04-30 NOTE — Assessment & Plan Note (Signed)
 On atorvastatin  with cholesterol levels to be re-evaluated next visit. - Continue atorvastatin  as prescribed. - Recheck cholesterol and labs at the next visit. - Advise fasting for at least 6 hours before lab tests.

## 2024-04-30 NOTE — Assessment & Plan Note (Signed)
 Two anxiety episodes occurred due to a lapse in Lexapro . Family support noted. - Resume Lexapro  as prescribed. - Ensure prescription is picked up and taken regularly. - Monitor for improvement in anxiety symptoms.

## 2024-07-04 ENCOUNTER — Encounter: Payer: Self-pay | Admitting: Nurse Practitioner
